# Patient Record
Sex: Male | Born: 1955 | Race: White | Hispanic: No | Marital: Single | Smoking: Never smoker
Health system: Southern US, Community
[De-identification: ages and names within clinical notes are randomized; demographics above are authoritative.]

## PROBLEM LIST (undated history)

## (undated) DIAGNOSIS — F32A Depression, unspecified: Secondary | ICD-10-CM

## (undated) DIAGNOSIS — S069X9A Unspecified intracranial injury with loss of consciousness of unspecified duration, initial encounter: Secondary | ICD-10-CM

## (undated) DIAGNOSIS — F329 Major depressive disorder, single episode, unspecified: Secondary | ICD-10-CM

## (undated) DIAGNOSIS — R569 Unspecified convulsions: Secondary | ICD-10-CM

## (undated) DIAGNOSIS — S069XAA Unspecified intracranial injury with loss of consciousness status unknown, initial encounter: Secondary | ICD-10-CM

## (undated) DIAGNOSIS — R269 Unspecified abnormalities of gait and mobility: Secondary | ICD-10-CM

## (undated) DIAGNOSIS — G40319 Generalized idiopathic epilepsy and epileptic syndromes, intractable, without status epilepticus: Secondary | ICD-10-CM

## (undated) HISTORY — DX: Unspecified intracranial injury with loss of consciousness status unknown, initial encounter: S06.9XAA

## (undated) HISTORY — DX: Unspecified abnormalities of gait and mobility: R26.9

## (undated) HISTORY — DX: Unspecified intracranial injury with loss of consciousness of unspecified duration, initial encounter: S06.9X9A

## (undated) HISTORY — PX: OTHER SURGICAL HISTORY: SHX169

## (undated) HISTORY — DX: Generalized idiopathic epilepsy and epileptic syndromes, intractable, without status epilepticus: G40.319

---

## 1997-09-08 ENCOUNTER — Emergency Department (HOSPITAL_COMMUNITY): Admission: EM | Admit: 1997-09-08 | Discharge: 1997-09-08 | Payer: Self-pay

## 1998-02-23 ENCOUNTER — Emergency Department (HOSPITAL_COMMUNITY): Admission: EM | Admit: 1998-02-23 | Discharge: 1998-02-23 | Payer: Self-pay | Admitting: Emergency Medicine

## 1998-03-21 ENCOUNTER — Emergency Department (HOSPITAL_COMMUNITY): Admission: EM | Admit: 1998-03-21 | Discharge: 1998-03-21 | Payer: Self-pay | Admitting: Emergency Medicine

## 1998-06-14 ENCOUNTER — Emergency Department (HOSPITAL_COMMUNITY): Admission: EM | Admit: 1998-06-14 | Discharge: 1998-06-14 | Payer: Self-pay | Admitting: Emergency Medicine

## 1999-03-24 ENCOUNTER — Inpatient Hospital Stay (HOSPITAL_COMMUNITY): Admission: EM | Admit: 1999-03-24 | Discharge: 1999-03-24 | Payer: Self-pay | Admitting: Emergency Medicine

## 1999-04-03 ENCOUNTER — Emergency Department (HOSPITAL_COMMUNITY): Admission: EM | Admit: 1999-04-03 | Discharge: 1999-04-03 | Payer: Self-pay | Admitting: Internal Medicine

## 1999-07-10 ENCOUNTER — Emergency Department (HOSPITAL_COMMUNITY): Admission: EM | Admit: 1999-07-10 | Discharge: 1999-07-10 | Payer: Self-pay | Admitting: Emergency Medicine

## 1999-07-29 ENCOUNTER — Encounter: Payer: Self-pay | Admitting: Emergency Medicine

## 1999-07-29 ENCOUNTER — Inpatient Hospital Stay (HOSPITAL_COMMUNITY): Admission: EM | Admit: 1999-07-29 | Discharge: 1999-07-30 | Payer: Self-pay | Admitting: Emergency Medicine

## 1999-08-16 ENCOUNTER — Inpatient Hospital Stay (HOSPITAL_COMMUNITY): Admission: EM | Admit: 1999-08-16 | Discharge: 1999-08-18 | Payer: Self-pay | Admitting: Emergency Medicine

## 1999-08-16 ENCOUNTER — Encounter: Payer: Self-pay | Admitting: Neurology

## 1999-09-26 ENCOUNTER — Emergency Department (HOSPITAL_COMMUNITY): Admission: EM | Admit: 1999-09-26 | Discharge: 1999-09-26 | Payer: Self-pay | Admitting: Emergency Medicine

## 2000-11-27 ENCOUNTER — Encounter: Payer: Self-pay | Admitting: Emergency Medicine

## 2000-11-27 ENCOUNTER — Emergency Department (HOSPITAL_COMMUNITY): Admission: EM | Admit: 2000-11-27 | Discharge: 2000-11-27 | Payer: Self-pay | Admitting: Emergency Medicine

## 2001-01-11 ENCOUNTER — Emergency Department (HOSPITAL_COMMUNITY): Admission: EM | Admit: 2001-01-11 | Discharge: 2001-01-11 | Payer: Self-pay

## 2001-01-31 ENCOUNTER — Encounter: Payer: Self-pay | Admitting: Emergency Medicine

## 2001-01-31 ENCOUNTER — Emergency Department (HOSPITAL_COMMUNITY): Admission: EM | Admit: 2001-01-31 | Discharge: 2001-01-31 | Payer: Self-pay | Admitting: Emergency Medicine

## 2001-04-05 ENCOUNTER — Emergency Department (HOSPITAL_COMMUNITY): Admission: EM | Admit: 2001-04-05 | Discharge: 2001-04-05 | Payer: Self-pay | Admitting: Emergency Medicine

## 2001-05-20 ENCOUNTER — Emergency Department (HOSPITAL_COMMUNITY): Admission: EM | Admit: 2001-05-20 | Discharge: 2001-05-20 | Payer: Self-pay | Admitting: Emergency Medicine

## 2001-06-15 ENCOUNTER — Emergency Department (HOSPITAL_COMMUNITY): Admission: EM | Admit: 2001-06-15 | Discharge: 2001-06-15 | Payer: Self-pay | Admitting: Emergency Medicine

## 2001-10-08 ENCOUNTER — Encounter: Payer: Self-pay | Admitting: Emergency Medicine

## 2001-10-08 ENCOUNTER — Emergency Department (HOSPITAL_COMMUNITY): Admission: EM | Admit: 2001-10-08 | Discharge: 2001-10-08 | Payer: Self-pay | Admitting: Emergency Medicine

## 2001-11-18 ENCOUNTER — Emergency Department (HOSPITAL_COMMUNITY): Admission: EM | Admit: 2001-11-18 | Discharge: 2001-11-18 | Payer: Self-pay | Admitting: Emergency Medicine

## 2002-02-17 ENCOUNTER — Encounter: Payer: Self-pay | Admitting: Emergency Medicine

## 2002-02-17 ENCOUNTER — Emergency Department (HOSPITAL_COMMUNITY): Admission: EM | Admit: 2002-02-17 | Discharge: 2002-02-17 | Payer: Self-pay | Admitting: Emergency Medicine

## 2002-05-27 ENCOUNTER — Observation Stay (HOSPITAL_COMMUNITY): Admission: EM | Admit: 2002-05-27 | Discharge: 2002-05-28 | Payer: Self-pay | Admitting: Emergency Medicine

## 2002-07-22 ENCOUNTER — Emergency Department (HOSPITAL_COMMUNITY): Admission: EM | Admit: 2002-07-22 | Discharge: 2002-07-22 | Payer: Self-pay | Admitting: Emergency Medicine

## 2002-08-12 ENCOUNTER — Emergency Department (HOSPITAL_COMMUNITY): Admission: EM | Admit: 2002-08-12 | Discharge: 2002-08-12 | Payer: Self-pay | Admitting: Emergency Medicine

## 2002-10-26 ENCOUNTER — Encounter: Payer: Self-pay | Admitting: Emergency Medicine

## 2002-10-26 ENCOUNTER — Emergency Department (HOSPITAL_COMMUNITY): Admission: EM | Admit: 2002-10-26 | Discharge: 2002-10-27 | Payer: Self-pay | Admitting: Emergency Medicine

## 2002-10-30 ENCOUNTER — Emergency Department (HOSPITAL_COMMUNITY): Admission: EM | Admit: 2002-10-30 | Discharge: 2002-10-30 | Payer: Self-pay | Admitting: Emergency Medicine

## 2003-05-26 ENCOUNTER — Emergency Department (HOSPITAL_COMMUNITY): Admission: EM | Admit: 2003-05-26 | Discharge: 2003-05-27 | Payer: Self-pay

## 2003-06-30 ENCOUNTER — Emergency Department (HOSPITAL_COMMUNITY): Admission: EM | Admit: 2003-06-30 | Discharge: 2003-06-30 | Payer: Self-pay | Admitting: Emergency Medicine

## 2003-07-01 ENCOUNTER — Emergency Department (HOSPITAL_COMMUNITY): Admission: EM | Admit: 2003-07-01 | Discharge: 2003-07-01 | Payer: Self-pay | Admitting: Emergency Medicine

## 2003-08-25 ENCOUNTER — Emergency Department (HOSPITAL_COMMUNITY): Admission: EM | Admit: 2003-08-25 | Discharge: 2003-08-26 | Payer: Self-pay | Admitting: Emergency Medicine

## 2003-10-18 ENCOUNTER — Emergency Department (HOSPITAL_COMMUNITY): Admission: EM | Admit: 2003-10-18 | Discharge: 2003-10-19 | Payer: Self-pay | Admitting: Emergency Medicine

## 2004-02-09 ENCOUNTER — Emergency Department (HOSPITAL_COMMUNITY): Admission: EM | Admit: 2004-02-09 | Discharge: 2004-02-09 | Payer: Self-pay | Admitting: Emergency Medicine

## 2004-02-23 ENCOUNTER — Emergency Department (HOSPITAL_COMMUNITY): Admission: EM | Admit: 2004-02-23 | Discharge: 2004-02-23 | Payer: Self-pay | Admitting: Emergency Medicine

## 2004-02-23 ENCOUNTER — Inpatient Hospital Stay (HOSPITAL_COMMUNITY): Admission: EM | Admit: 2004-02-23 | Discharge: 2004-02-25 | Payer: Self-pay | Admitting: Emergency Medicine

## 2004-02-26 ENCOUNTER — Emergency Department (HOSPITAL_COMMUNITY): Admission: EM | Admit: 2004-02-26 | Discharge: 2004-02-27 | Payer: Self-pay | Admitting: Emergency Medicine

## 2004-04-21 ENCOUNTER — Observation Stay (HOSPITAL_COMMUNITY): Admission: EM | Admit: 2004-04-21 | Discharge: 2004-04-22 | Payer: Self-pay | Admitting: Emergency Medicine

## 2004-05-30 ENCOUNTER — Ambulatory Visit: Payer: Self-pay | Admitting: Critical Care Medicine

## 2004-05-30 ENCOUNTER — Inpatient Hospital Stay (HOSPITAL_COMMUNITY): Admission: EM | Admit: 2004-05-30 | Discharge: 2004-06-02 | Payer: Self-pay | Admitting: Emergency Medicine

## 2005-06-25 ENCOUNTER — Observation Stay (HOSPITAL_COMMUNITY): Admission: EM | Admit: 2005-06-25 | Discharge: 2005-06-26 | Payer: Self-pay | Admitting: Emergency Medicine

## 2006-03-21 IMAGING — CR DG CHEST 2V
2 series · 2 of 2 positions shown · non-contrast
Comparison: Report of chest x-ray 11/27/00 reviewed.

CLINICAL DATA: Seizure.  
 PA AND LATERAL CHEST:

[view not recorded (1 of 2)]
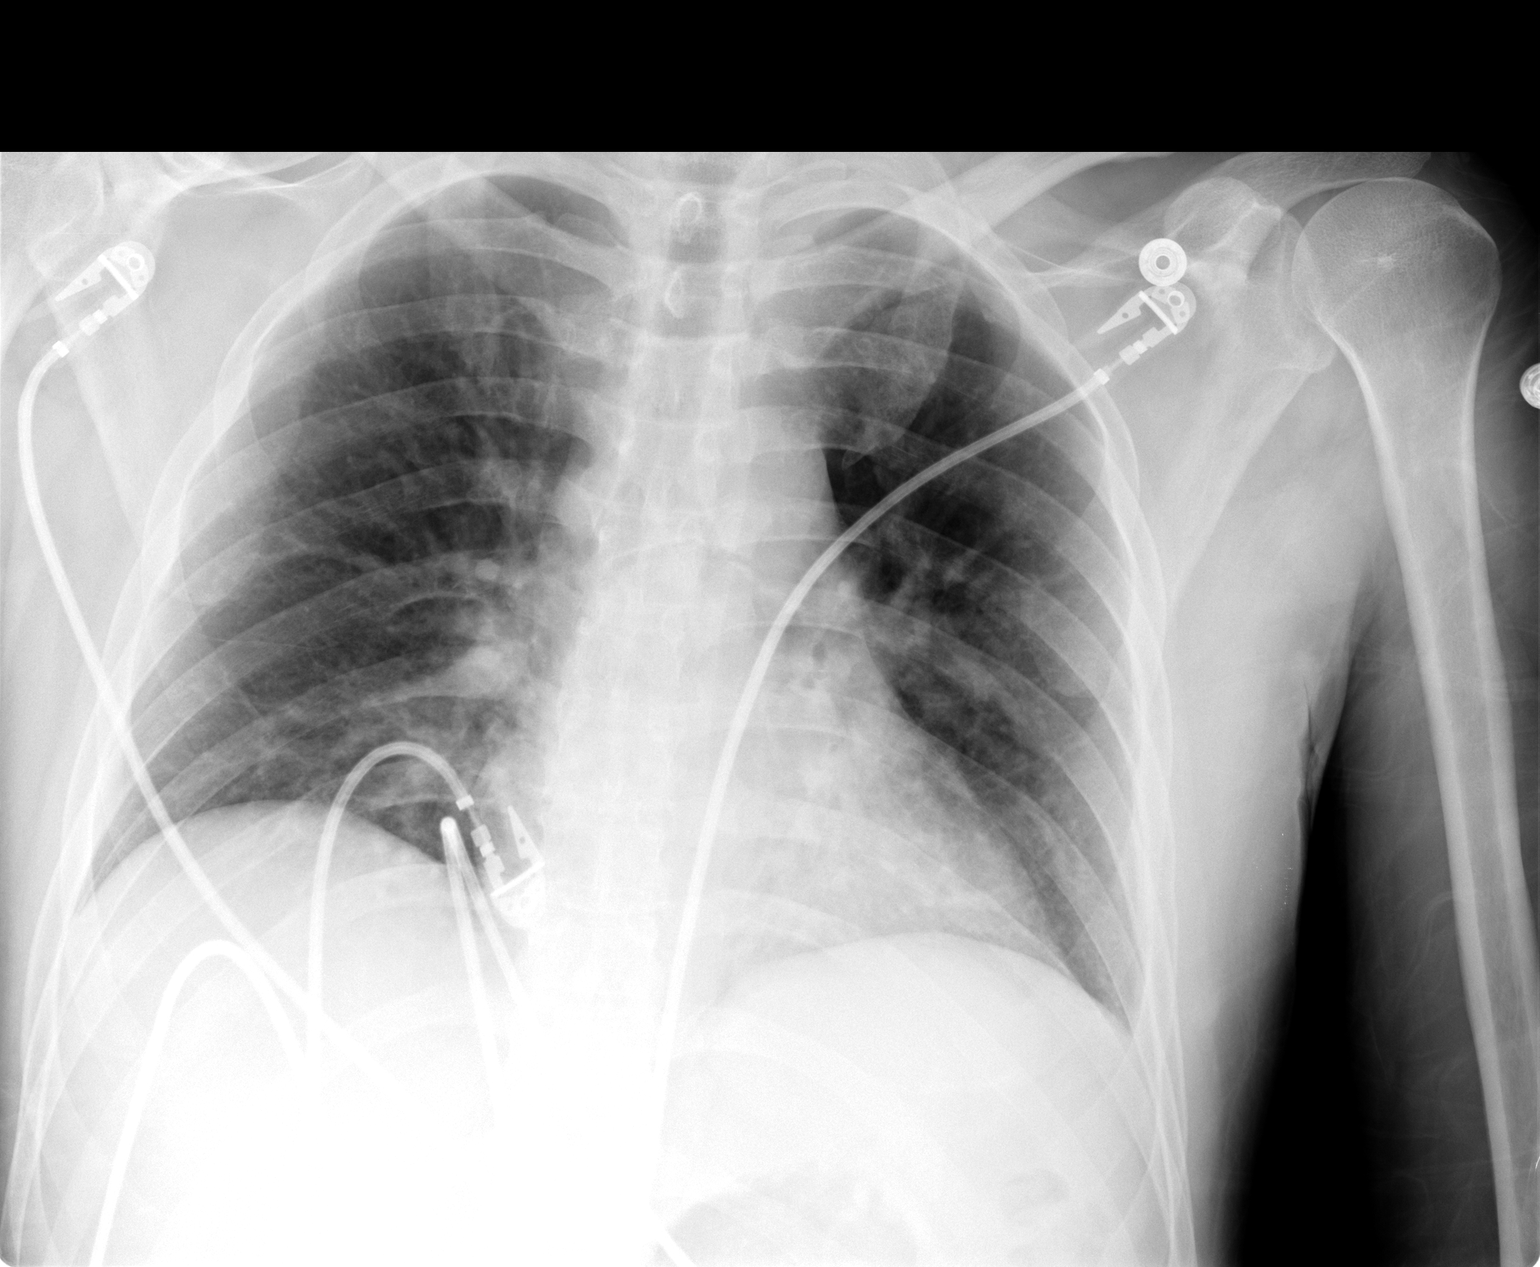

[view not recorded (2 of 2)]
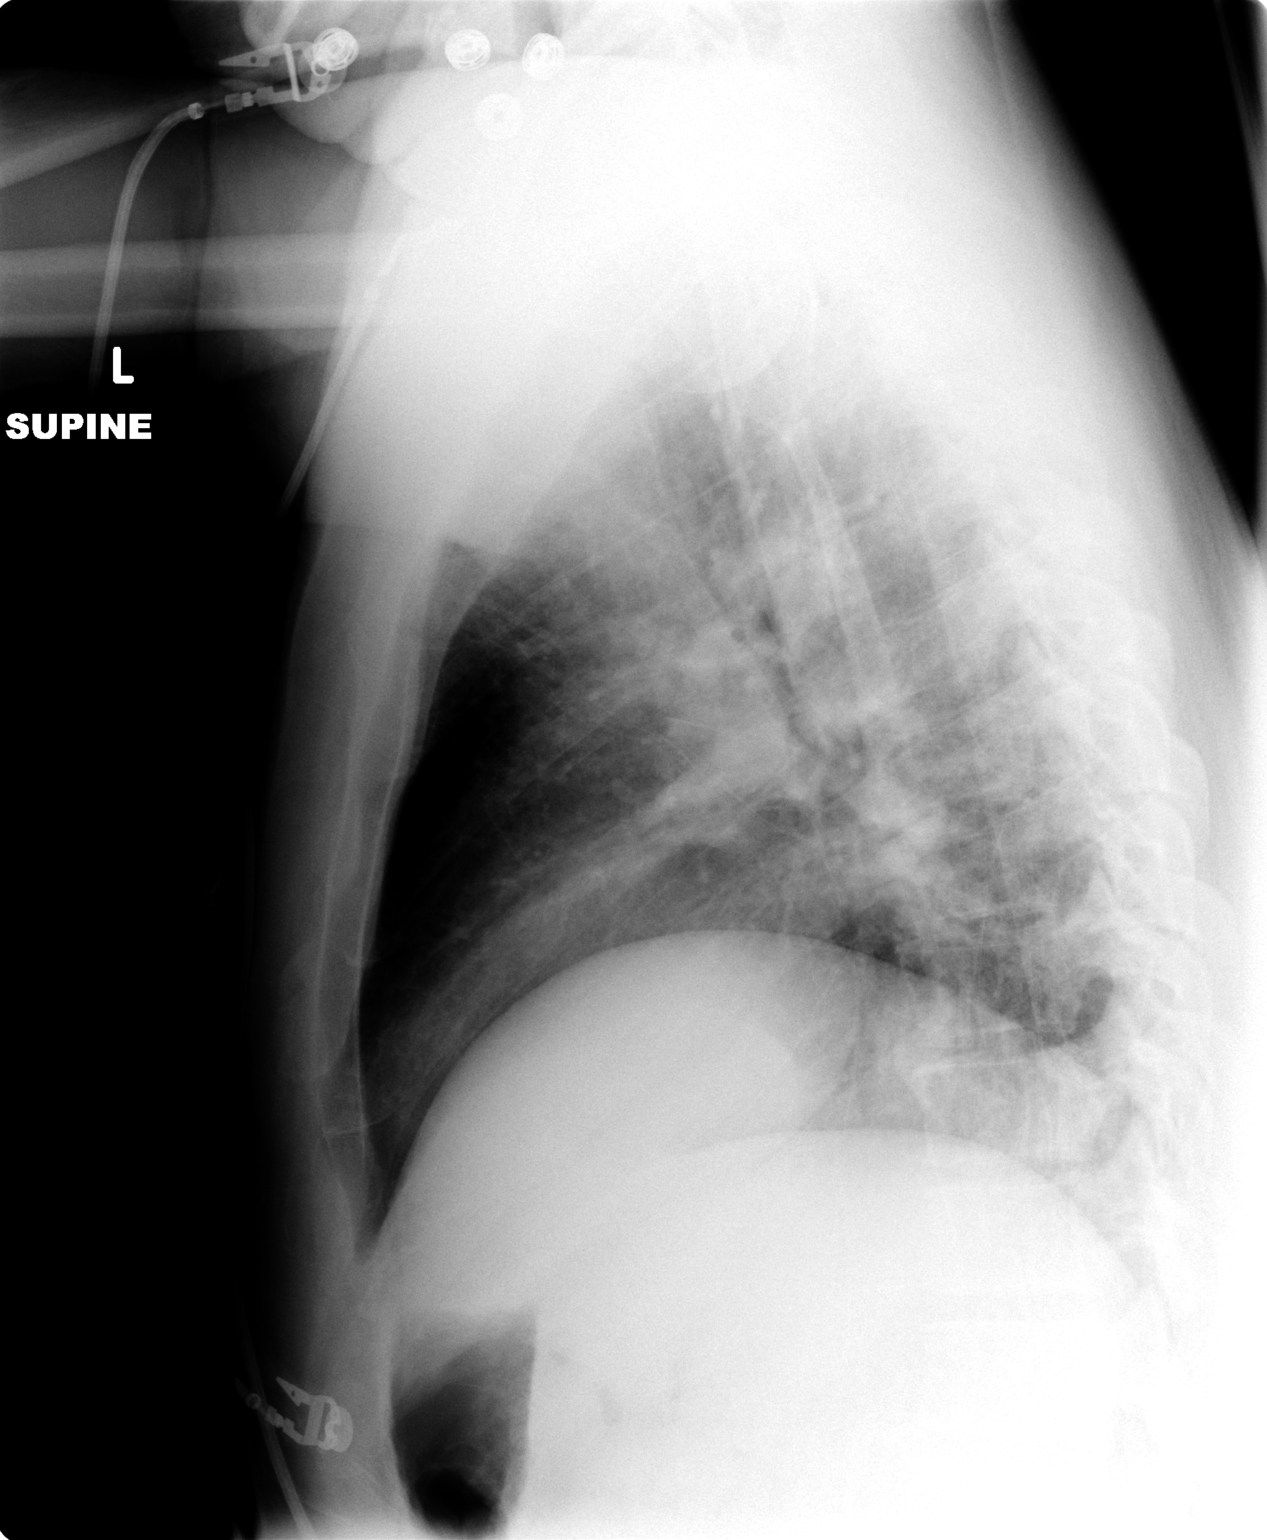

[2 of 2 positions shown; findings below may reference images not displayed]

FINDINGS: There is some airspace disease identified in the lower lung zones bilaterally with some left mid lung zone airspace disease also identified.  No pleural effusion.  Heart size normal.  No focal bony abnormality.
IMPRESSION: Bibasilar airspace disease.  This could represent aspiration or infection.

## 2006-03-21 IMAGING — CT CT HEAD W/O CM
1 series · 16 of 30 positions shown, 20 images · non-contrast
Comparison: 02/09/04.

CLINICAL DATA: Seizures and headaches.  
 HEAD CT WITHOUT CONTRAST 02/23/04:
TECHNIQUE: Contiguous axial CT images were taken from the skull base to the vertex without contrast administration.

[Series 2: brain · axial · 0.49mm/px · z∈[+146,+290]mm · 16 of 32 slices shown, 20 images]
[im 2/32  brain]
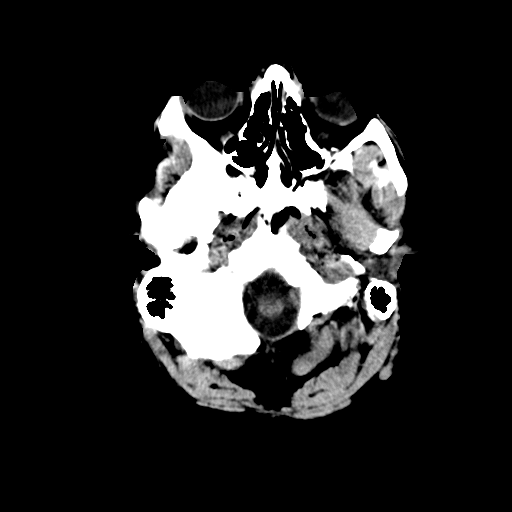
[im 2/32  bone]
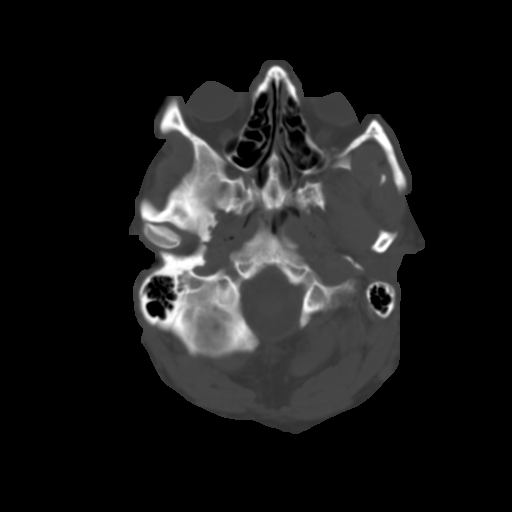
[im 4/32  brain]
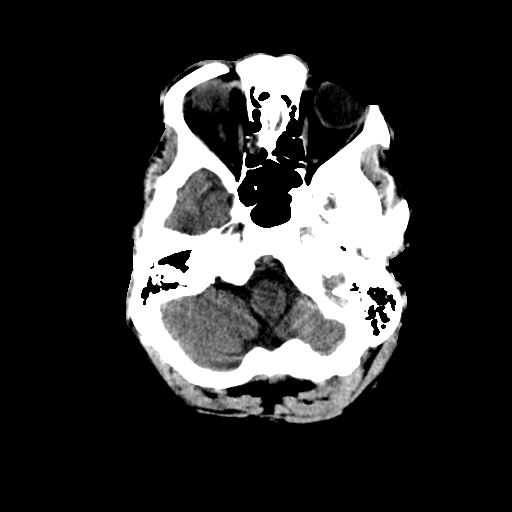
[im 6/32  brain]
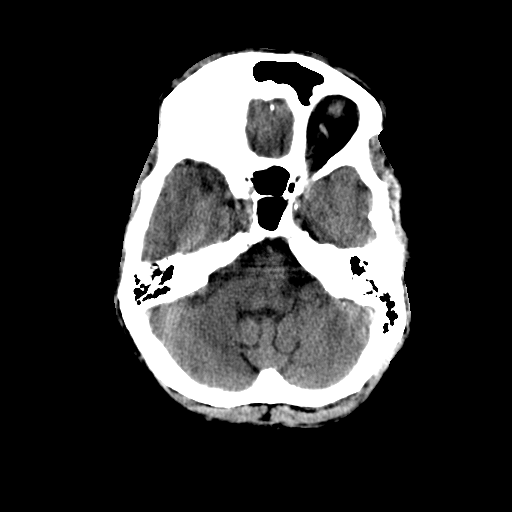
[im 8/32  brain]
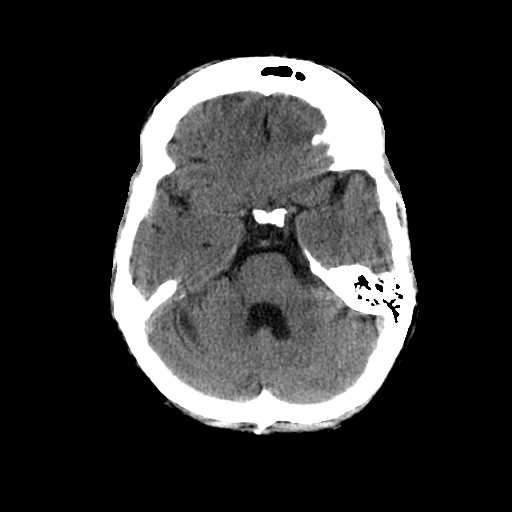
[im 9/32  brain]
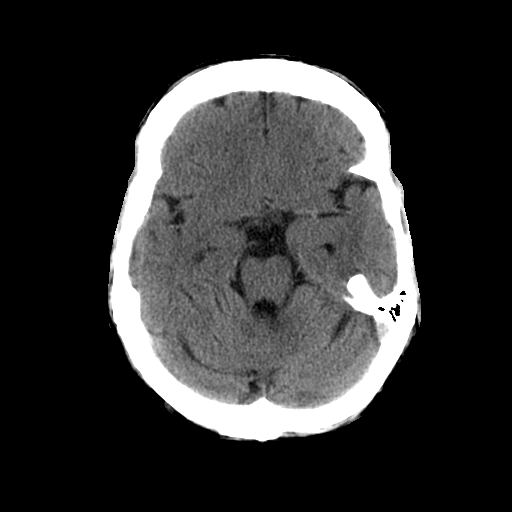
[im 9/32  bone]
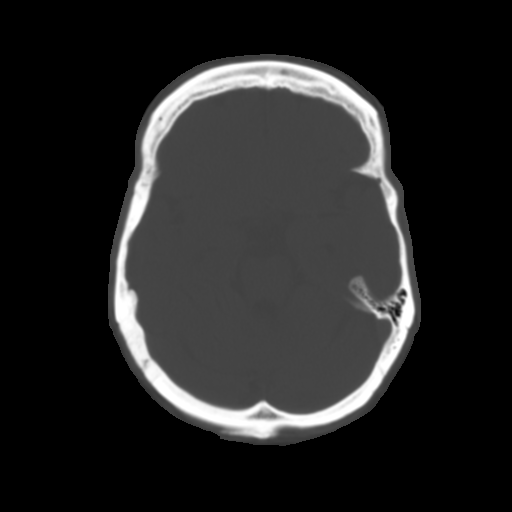
[im 11/32  brain]
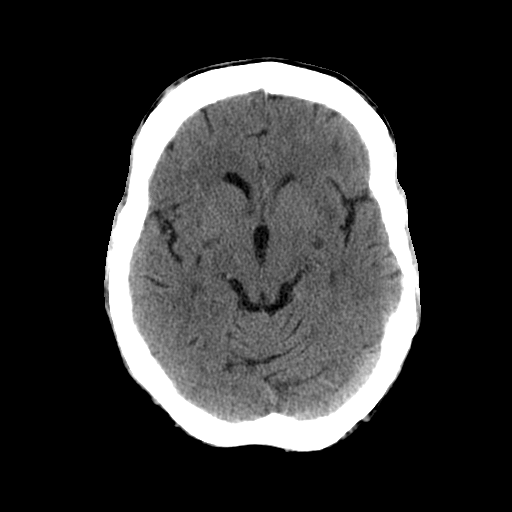
[im 13/32  brain]
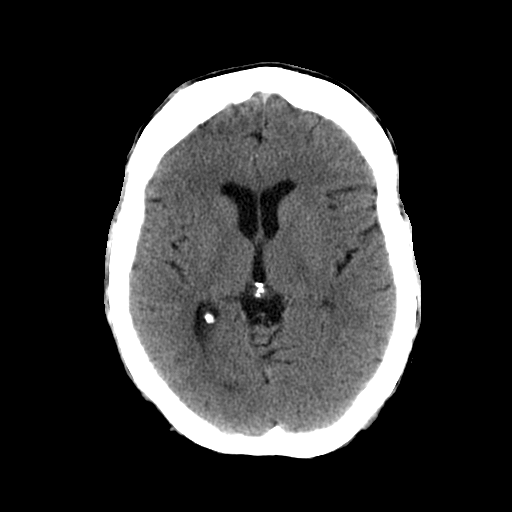
[im 15/32  brain]
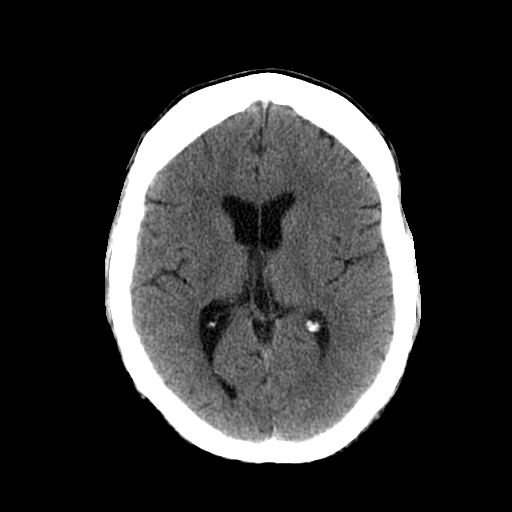
[im 17/32  brain]
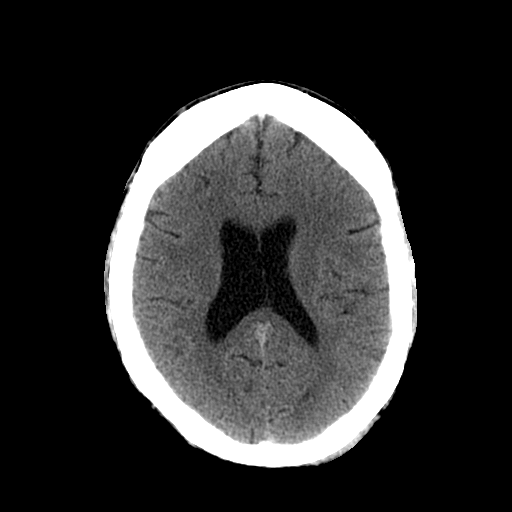
[im 17/32  bone]
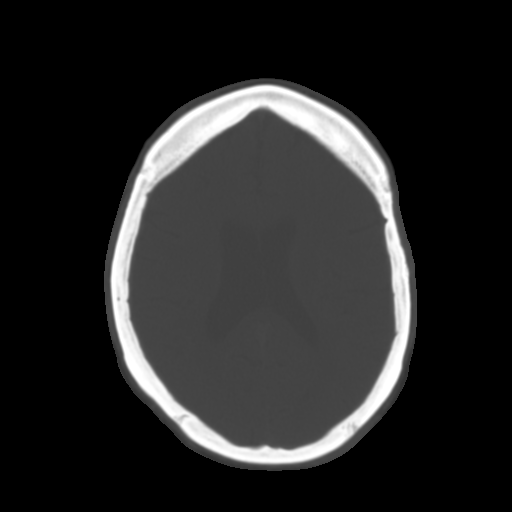
[im 19/32  brain]
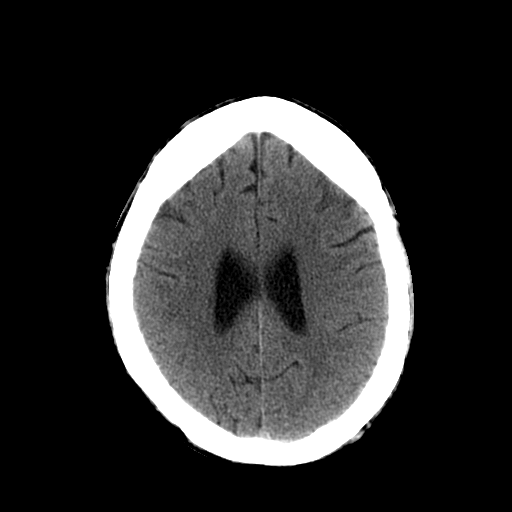
[im 21/32  brain]
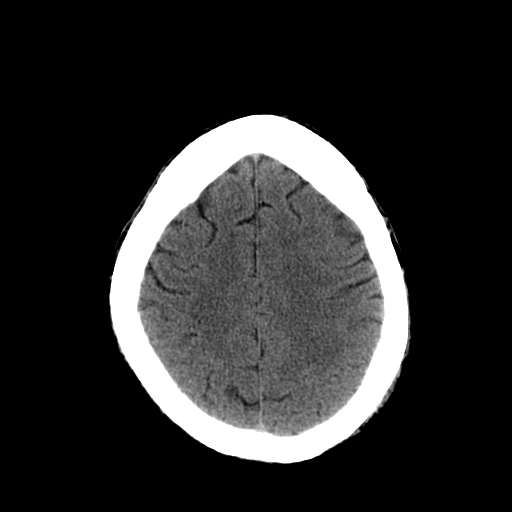
[im 23/32  brain]
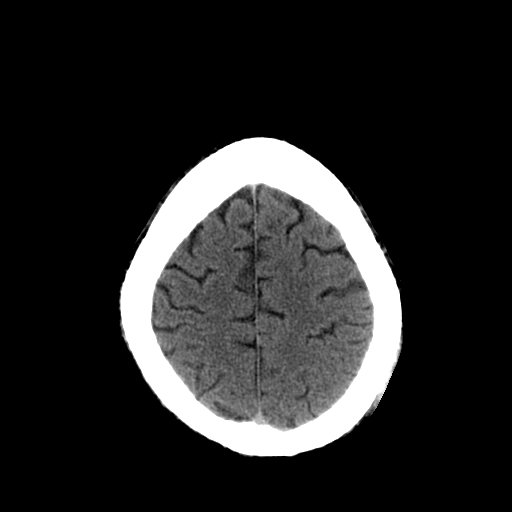
[im 24/32  brain]
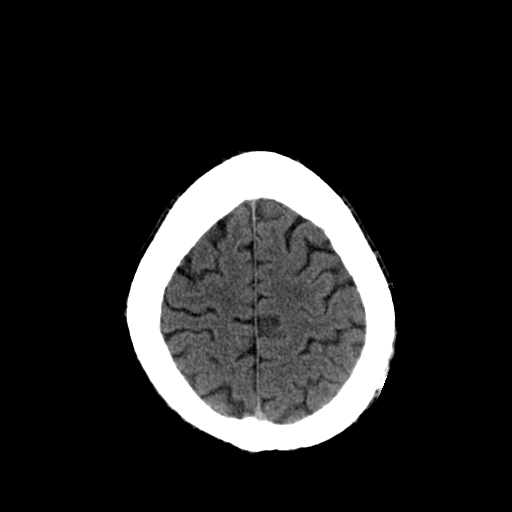
[im 24/32  bone]
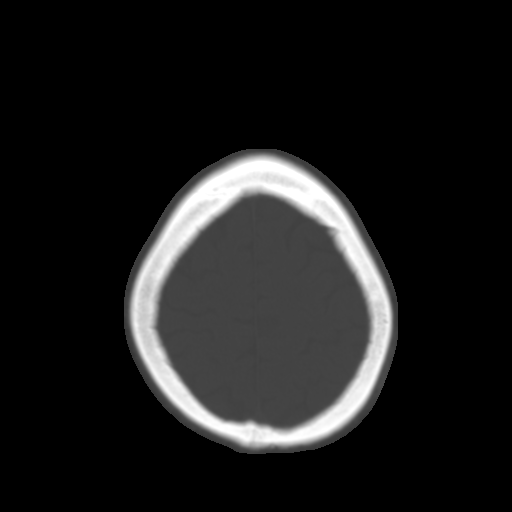
[im 26/32  brain]
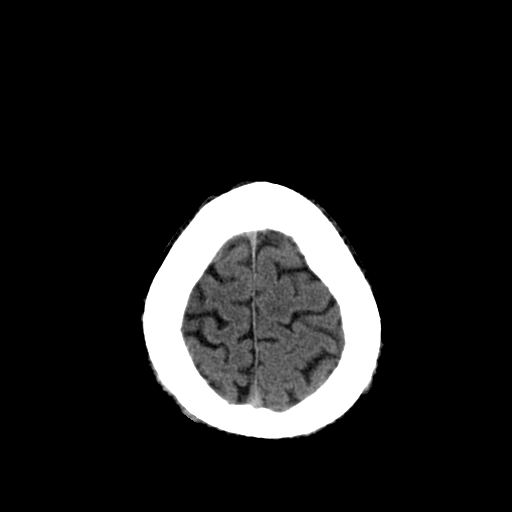
[im 28/32  brain]
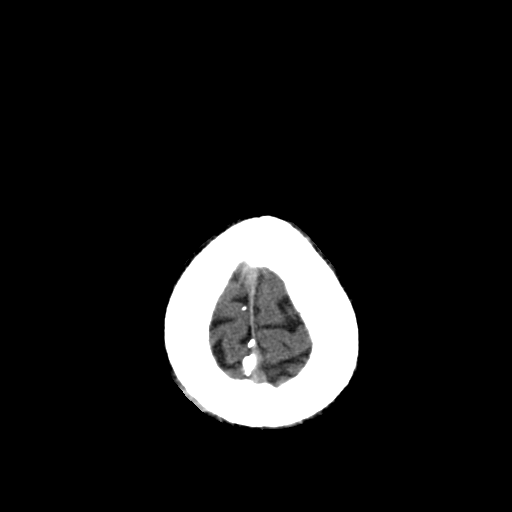
[im 30/32  brain]
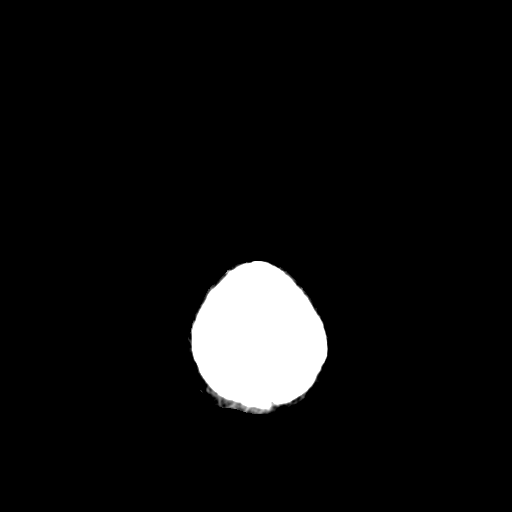

[16 of 30 positions shown; findings below may reference images not displayed]

FINDINGS: No evidence of acute intracranial abnormality including hemorrhage, infarct, mass, mass effect, midline or abnormal extraaxial fluid collections identified.  Mild atrophy is unchanged.  Minimal ethmoid sinus opacification is again noted.
IMPRESSION: No acute abnormality.

## 2012-07-25 ENCOUNTER — Other Ambulatory Visit: Payer: Self-pay | Admitting: Neurology

## 2012-07-26 ENCOUNTER — Other Ambulatory Visit: Payer: Self-pay | Admitting: Neurology

## 2012-07-26 ENCOUNTER — Other Ambulatory Visit: Payer: Self-pay

## 2012-07-26 MED ORDER — LAMICTAL 200 MG PO TABS: ORAL_TABLET | ORAL | Status: DC

## 2012-07-26 MED ORDER — PHENOBARBITAL 97.2 MG PO TABS: 97.2000 mg | ORAL_TABLET | Freq: Every evening | ORAL | Status: DC

## 2012-07-26 MED ORDER — LEVETIRACETAM 750 MG PO TABS: 750.0000 mg | ORAL_TABLET | Freq: Two times a day (BID) | ORAL | Status: DC

## 2012-08-13 ENCOUNTER — Encounter (HOSPITAL_COMMUNITY): Payer: Self-pay | Admitting: Emergency Medicine

## 2012-08-13 ENCOUNTER — Emergency Department (HOSPITAL_COMMUNITY)
Admission: EM | Admit: 2012-08-13 | Discharge: 2012-08-13 | Disposition: A | Discharge status: Home / Self Care | Payer: Medicare Other | Attending: Emergency Medicine | Admitting: Emergency Medicine

## 2012-08-13 DIAGNOSIS — Z760 Encounter for issue of repeat prescription: Secondary | ICD-10-CM | POA: Insufficient documentation

## 2012-08-13 DIAGNOSIS — Z79899 Other long term (current) drug therapy: Secondary | ICD-10-CM

## 2012-08-13 HISTORY — DX: Unspecified convulsions: R56.9

## 2012-08-13 HISTORY — DX: Depression, unspecified: F32.A

## 2012-08-13 HISTORY — DX: Major depressive disorder, single episode, unspecified: F32.9

## 2012-08-13 MED ORDER — LEVETIRACETAM 750 MG PO TABS
750.0000 mg | ORAL_TABLET | Freq: Two times a day (BID) | ORAL | Status: DC
  Administered 2012-08-13: 750 mg via ORAL
  Filled 2012-08-13: qty 1

## 2012-08-13 MED ORDER — LAMOTRIGINE 200 MG PO TABS
200.0000 mg | ORAL_TABLET | Freq: Once | ORAL | Status: AC
  Administered 2012-08-13: 200 mg via ORAL
  Filled 2012-08-13: qty 1

## 2012-08-13 NOTE — ED Provider Notes (Signed)
   History    CSN: [id number] Arrival date & time 08/13/12  0105  First MD Initiated Contact with Patient 08/13/12 0122     Chief Complaint  Patient presents with  . Medication Refill   (Consider location/radiation/quality/duration/timing/severity/associated sxs/prior Treatment) The history is provided by the patient. No language interpreter was used.  Patient states he needs to walk home as he missed the last bus but hasn't had his evening medications and is here asking for not refills but doses of his meds so he can walk home.  No complaints at this time.  He is awake alert pleasant and cooperative.   No past medical history on file. No past surgical history on file. No family history on file. History  Substance Use Topics  . Smoking status: Not on file  . Smokeless tobacco: Not on file  . Alcohol Use: Not on file    Review of Systems  Constitutional: Negative for fever.  Respiratory: Negative for shortness of breath.   All other systems reviewed and are negative.    Allergies  Review of patient's allergies indicates not on file.  Home Medications   Current Outpatient Rx  Name  Route  Sig  Dispense  Refill  . LAMICTAL 200 MG tablet      One tablet in the morning and one and one half tablet in the evening Brand Medically Necessary   75 tablet   6     Dispense as written.   . levETIRAcetam (KEPPRA) 750 MG tablet   Oral   Take 1 tablet (750 mg total) by mouth 2 (two) times daily. Brand Medically Necessary   60 tablet   6     Dispense as written.   Marland Kitchen PHENobarbital (LUMINAL) 97.2 MG tablet   Oral   Take 1 tablet (97.2 mg total) by mouth every evening.   30 tablet   5     Pharmacy Fax [phone]    There were no vitals taken for this visit. Physical Exam  Constitutional: He is oriented to person, place, and time. He appears well-developed and well-nourished. No distress.  HENT:  Head: Normocephalic and atraumatic.  Mouth/Throat: Oropharynx is clear and  moist.  Eyes: Conjunctivae are normal. Pupils are equal, round, and reactive to light.  Neck: Normal range of motion. Neck supple.  Cardiovascular: Normal rate and regular rhythm.   Pulmonary/Chest: Effort normal and breath sounds normal. He has no wheezes.  Abdominal: Soft. Bowel sounds are normal. There is no tenderness. There is no rebound and no guarding.  Musculoskeletal: Normal range of motion.  Neurological: He is alert and oriented to person, place, and time.  Skin: Skin is warm and dry.  Psychiatric: He has a normal mood and affect.    ED Course  Procedures (including critical care time) Labs Reviewed - No data to display No results found. No diagnosis found.  MDM  Have offered patient soda and crackers to coat his stomach prior to evening meds which were ordered he is happy with this plan.  Will d/c to home post medication  Julina Altmann K Zeph Riebel-Rasch, MD 08/13/12 1478

## 2012-08-13 NOTE — ED Notes (Signed)
[place]. He does not want to pay for a cab and needs a dose of his medications. Pt takes Kepra, Lamictal and Phenobarbital.

## 2012-09-02 ENCOUNTER — Telehealth: Payer: Self-pay | Admitting: Neurology

## 2012-09-02 NOTE — Telephone Encounter (Signed)
The patient recently sent a letter indicating that he is having difficulty getting an appointment. We will try to get him seen through a NP. Otherwise, he is doing well with his seizures, he just needs followup and blood work.

## 2013-02-15 ENCOUNTER — Telehealth: Payer: Self-pay | Admitting: *Deleted

## 2013-02-15 ENCOUNTER — Other Ambulatory Visit: Payer: Self-pay | Admitting: Neurology

## 2013-02-15 MED ORDER — LEVETIRACETAM 750 MG PO TABS: 750.0000 mg | ORAL_TABLET | Freq: Two times a day (BID) | ORAL | Status: DC

## 2013-02-15 MED ORDER — LAMICTAL 200 MG PO TABS: ORAL_TABLET | ORAL | Status: DC

## 2013-02-15 NOTE — Telephone Encounter (Signed)
Rx's have been sent. 

## 2013-02-16 ENCOUNTER — Ambulatory Visit: Payer: Self-pay | Admitting: Neurology

## 2013-02-16 ENCOUNTER — Telehealth: Payer: Self-pay | Admitting: Neurology

## 2013-02-16 NOTE — Telephone Encounter (Signed)
This patient did not show up for a revisit appointment today. 

## 2013-05-08 ENCOUNTER — Emergency Department (HOSPITAL_COMMUNITY): Payer: Medicare Other

## 2013-05-08 ENCOUNTER — Emergency Department (HOSPITAL_COMMUNITY)
Admission: EM | Admit: 2013-05-08 | Discharge: 2013-05-08 | Discharge status: Against Medical Advice | Payer: Medicare Other | Attending: Emergency Medicine | Admitting: Emergency Medicine

## 2013-05-08 ENCOUNTER — Encounter (HOSPITAL_COMMUNITY): Payer: Self-pay | Admitting: Emergency Medicine

## 2013-05-08 DIAGNOSIS — R079 Chest pain, unspecified: Secondary | ICD-10-CM | POA: Insufficient documentation

## 2013-05-08 NOTE — ED Notes (Signed)
Pt would like to leave AMA. He states "my pain is gone so i don't need to see a doctor".
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
## 2013-08-27 ENCOUNTER — Other Ambulatory Visit: Payer: Self-pay | Admitting: Neurology

## 2013-08-27 NOTE — Telephone Encounter (Signed)
Patient has not been seen in over 1 year, no showed last appt  

## 2013-09-01 ENCOUNTER — Telehealth: Payer: Self-pay | Admitting: Neurology

## 2013-09-01 ENCOUNTER — Ambulatory Visit: Payer: Self-pay | Admitting: Neurology

## 2013-09-01 NOTE — Telephone Encounter (Signed)
This patient did not show for a revisit appointment today. 

## 2013-09-04 ENCOUNTER — Ambulatory Visit (INDEPENDENT_AMBULATORY_CARE_PROVIDER_SITE_OTHER): Payer: Medicare Other | Admitting: Neurology

## 2013-09-04 ENCOUNTER — Telehealth: Payer: Self-pay | Admitting: Neurology

## 2013-09-04 ENCOUNTER — Ambulatory Visit: Payer: Self-pay | Admitting: Neurology

## 2013-09-04 ENCOUNTER — Encounter: Payer: Self-pay | Admitting: Neurology

## 2013-09-04 VITALS — BP 127/67 | HR 66 | Wt 160.0 lb

## 2013-09-04 DIAGNOSIS — Z5181 Encounter for therapeutic drug level monitoring: Secondary | ICD-10-CM

## 2013-09-04 DIAGNOSIS — G40319 Generalized idiopathic epilepsy and epileptic syndromes, intractable, without status epilepticus: Secondary | ICD-10-CM | POA: Diagnosis not present

## 2013-09-04 HISTORY — DX: Generalized idiopathic epilepsy and epileptic syndromes, intractable, without status epilepticus: G40.319

## 2013-09-04 MED ORDER — PHENOBARBITAL 97.2 MG PO TABS: 97.2000 mg | ORAL_TABLET | Freq: Every day | ORAL | Status: DC

## 2013-09-04 MED ORDER — LEVETIRACETAM 750 MG PO TABS: 750.0000 mg | ORAL_TABLET | Freq: Two times a day (BID) | ORAL | Status: DC

## 2013-09-04 MED ORDER — LAMOTRIGINE 200 MG PO TABS: ORAL_TABLET | ORAL | Status: DC

## 2013-09-04 NOTE — Progress Notes (Signed)
    Reason for visit: Seizures  Craig PapJames E Fox is an 58 y.o. male  History of present illness:  Craig Fox is a 58 year old right-handed white male with a history of intractable seizures. He currently lives alone in an apartment, and he relies on a bus to get around. The patient indicates that during the hot months of summer, his seizures are a bit more frequent. He has had a seizure with sleep within the last several days. He indicates that his seizures usually are under good control. His does not operate a motor vehicle. The patient is on phenobarbital, Lamictal, and Keppra. He is tolerating medications fairly well. He does have a mild gait disturbance, denies any recent falls.  Past Medical History  Diagnosis Date  . Seizures   . Depression   . 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 Traumatic brain injury   . Gait disorder     Past Surgical History  Procedure Laterality Date  . Traumatic amputation finger Left     Ring finger    Family History  Problem Relation Age of Onset  . Mental illness Mother   . Heart attack Father     Social history:  reports that he has never smoked. He has never used smokeless tobacco. He reports that he does not drink alcohol or use illicit drugs.   No Known Allergies  Medications:  No current outpatient prescriptions on file prior to visit.   No current facility-administered medications on file prior to visit.    ROS:  Out of a complete 14 system review of symptoms, the patient complains only of the following symptoms, and all other reviewed systems are negative.  Appetite change Weight loss Seizures  Blood pressure 127/67, pulse 66, weight 160 lb (72.576 kg).  Physical Exam  General: The patient is alert and cooperative at the time of the examination.  Skin: No significant peripheral edema is noted.   Neurologic Exam  Mental status: The patient is oriented x 3.  Cranial nerves: Facial  symmetry is present. Speech is normal, no aphasia or dysarthria is noted. Extraocular movements are full. Visual fields are full.  Motor: The patient has good strength in all 4 extremities.  Sensory examination: Soft touch sensation is symmetric on the face, arms, and legs.  Coordination: The patient has good heel-to-shin bilaterally. Slight dysmetria seen with finger-nose-finger bilaterally.  Gait and station: The patient has a slight circumduction gait with the right leg. Tandem gait is unsteady. Romberg is negative. No drift is seen.  Reflexes: Deep tendon reflexes are symmetric.   Assessment/Plan:  1. Traumatic brain injury  2. Intractable seizures  The patient continues to have occasional seizures with his medication regimen. The patient lives alone, and he has difficulty keeping up with his appointments. He has not shown up for the last 4 revisits scheduled. The patient does not operate a motor vehicle, but he may do better if he were living in a group home situation. The patient will have blood work drawn today, he will have a revisit in 6 months. He has no primary care physician.  Marlan Palau. Keith Willis MD 09/04/2013 2:15 PM  [facility] [address] [place], [place] [id number]  Phone [phone] Fax [phone]

## 2013-09-04 NOTE — Patient Instructions (Signed)

## 2013-09-04 NOTE — Telephone Encounter (Signed)
This patient did not show for a revisit appointment today. 

## 2013-09-05 LAB — COMPREHENSIVE METABOLIC PANEL
ALBUMIN: 4.8 g/dL (ref 3.5–5.5)
ALT: 16 IU/L (ref 0–44)
AST: 23 IU/L (ref 0–40)
Albumin/Globulin Ratio: 2.2 (ref 1.1–2.5)
Alkaline Phosphatase: 142 IU/L — ABNORMAL HIGH (ref 39–117)
BILIRUBIN TOTAL: 0.4 mg/dL (ref 0.0–1.2)
BUN/Creatinine Ratio: 16 (ref 9–20)
BUN: 14 mg/dL (ref 6–24)
CALCIUM: 9.8 mg/dL (ref 8.7–10.2)
CHLORIDE: 100 mmol/L (ref 97–108)
CO2: 23 mmol/L (ref 18–29)
Creatinine, Ser: 0.9 mg/dL (ref 0.76–1.27)
GFR, EST AFRICAN AMERICAN: 109 mL/min/{1.73_m2} (ref >59)
GFR, EST NON AFRICAN AMERICAN: 94 mL/min/{1.73_m2} (ref >59)
GLUCOSE: 74 mg/dL (ref 65–99)
Globulin, Total: 2.2 g/dL (ref 1.5–4.5)
Potassium: 4.1 mmol/L (ref 3.5–5.2)
Sodium: 144 mmol/L (ref 134–144)
TOTAL PROTEIN: 7 g/dL (ref 6.0–8.5)

## 2013-09-05 LAB — CBC WITH DIFFERENTIAL
BASOS ABS: 0 10*3/uL (ref 0.0–0.2)
BASOS: 0 %
EOS: 1 %
Eosinophils Absolute: 0.1 10*3/uL (ref 0.0–0.4)
HEMATOCRIT: 41.7 % (ref 37.5–51.0)
HEMOGLOBIN: 14.2 g/dL (ref 12.6–17.7)
Immature Grans (Abs): 0 10*3/uL (ref 0.0–0.1)
Immature Granulocytes: 0 %
LYMPHS ABS: 1.2 10*3/uL (ref 0.7–3.1)
LYMPHS: 18 %
MCH: 29.6 pg (ref 26.6–33.0)
MCHC: 34.1 g/dL (ref 31.5–35.7)
MCV: 87 fL (ref 79–97)
MONOCYTES: 9 %
Monocytes Absolute: 0.6 10*3/uL (ref 0.1–0.9)
NEUTROS ABS: 4.9 10*3/uL (ref 1.4–7.0)
Neutrophils Relative %: 72 %
Platelets: 249 10*3/uL (ref 150–379)
RBC: 4.8 x10E6/uL (ref 4.14–5.80)
RDW: 13.6 % (ref 12.3–15.4)
WBC: 6.7 10*3/uL (ref 3.4–10.8)

## 2013-09-05 LAB — LAMOTRIGINE LEVEL: LAMOTRIGINE LVL: 10.2 ug/mL (ref 2.0–20.0)

## 2013-09-05 LAB — PHENOBARBITAL LEVEL: PHENOBARBITAL LVL: 25 ug/mL (ref 15–40)

## 2013-09-08 ENCOUNTER — Encounter: Payer: Self-pay | Admitting: *Deleted

## 2013-09-08 NOTE — Progress Notes (Signed)
Quick Note:  I sent letter to pt, since I have not been able to connect with by phone. ______

## 2014-01-17 ENCOUNTER — Telehealth: Payer: Self-pay | Admitting: Neurology

## 2014-01-17 NOTE — Telephone Encounter (Signed)
Pt states he got a notice from [facility] that lamoTRIgine (LAMICTAL) 200 MG tablet and levETIRAcetam (KEPPRA) 750 MG tablet will no longer be covered.  He states that he needs brand name and he needs someone to contact [facility]. You can call him back and if he does not answer you can leave him a voicemail.

## 2014-01-17 NOTE — Telephone Encounter (Signed)
I have provided all clinical info requested by ins.  The requests are currently under review.  I called the patient back.  Phone rang over 20 times with no answer.  Voicemail did not pick up.  Unable to leave message.

## 2014-01-18 NOTE — Telephone Encounter (Signed)
[employer] sent us a reply saying the patient does not have active prescription coverage with them.  I called the patient back.  He said he may have forgotten to send them a check for payment.  I asked the patient if he had his Rx card, so we could verify we have the correct info.  Patient says his Ins is with [place].  ID# [id number] BIN: [id number] Group: [id number] PCN: [id number] Phone [phone].  I have contacted this plan and provided clinical info.  They will review the request and notify the patient of outcome.  They will call us back if anything further is needed. Patient is aware.

## 2014-01-22 ENCOUNTER — Telehealth: Payer: Self-pay | Admitting: Neurology

## 2014-01-22 MED ORDER — LAMOTRIGINE 200 MG PO TABS: ORAL_TABLET | ORAL | Status: DC

## 2014-01-22 MED ORDER — LEVETIRACETAM 750 MG PO TABS: 750.0000 mg | ORAL_TABLET | Freq: Two times a day (BID) | ORAL | Status: DC

## 2014-01-22 NOTE — Telephone Encounter (Signed)
The patient sent a letter indicating that the insurance changed his Keppra and Lamictal to generic. I will rewrite prescriptions indicating that they are to be brand-name only.

## 2014-03-13 ENCOUNTER — Ambulatory Visit: Payer: Medicare Other | Admitting: Adult Health

## 2014-03-20 ENCOUNTER — Telehealth: Payer: Self-pay | Admitting: Neurology

## 2014-03-20 ENCOUNTER — Other Ambulatory Visit: Payer: Self-pay | Admitting: Neurology

## 2014-03-20 MED ORDER — LAMOTRIGINE 200 MG PO TABS: ORAL_TABLET | ORAL | Status: DC

## 2014-03-20 MED ORDER — LEVETIRACETAM 750 MG PO TABS: 750.0000 mg | ORAL_TABLET | Freq: Two times a day (BID) | ORAL | Status: DC

## 2014-03-20 NOTE — Telephone Encounter (Signed)
Request for BMN Rx's sent to the pharmacy.  I called the pharmacy and spoke with Clydie BraunKaren.  She said they have placed the refills on hold because the patient just got these filled the end of Jan, so it is too soon.  She said they did go through ins, and no prior auth was required.  I called the patient back.  He verbalized understanding.

## 2014-03-20 NOTE — Telephone Encounter (Signed)
Pt is calling needing a refill on PHENobarbital (LUMINAL) 97.2 MG tablet. [facility] [address]  He needs this as soon as possible. Pt also wants to know where he can get his Rx's cheaper. Please leave a message if no answer his phone will just beep there is no audio voice. He states that he has a refill, but the pharmacy will not give it to him.  He seems to be confused. Please advise.

## 2014-03-20 NOTE — Telephone Encounter (Signed)
Rx signed and faxed.

## 2014-03-20 NOTE — Telephone Encounter (Signed)
Patient is calling as he needs authorization for Rx Keppra 750 mg and Lamictal 200 mg.  Please see note from Dr Anne HahnWillis on 01/22/14 approving these brand names.Please call patient.  Thanks!

## 2014-04-02 ENCOUNTER — Ambulatory Visit: Payer: Self-pay | Admitting: Adult Health

## 2014-04-03 ENCOUNTER — Encounter: Payer: Self-pay | Admitting: Adult Health

## 2014-05-07 ENCOUNTER — Ambulatory Visit (INDEPENDENT_AMBULATORY_CARE_PROVIDER_SITE_OTHER): Payer: Self-pay | Admitting: Adult Health

## 2014-05-07 ENCOUNTER — Encounter: Payer: Self-pay | Admitting: Adult Health

## 2014-05-07 VITALS — BP 122/62 | HR 79 | Ht 72.0 in | Wt 162.0 lb

## 2014-05-07 DIAGNOSIS — Z5181 Encounter for therapeutic drug level monitoring: Secondary | ICD-10-CM | POA: Diagnosis not present

## 2014-05-07 DIAGNOSIS — G40319 Generalized idiopathic epilepsy and epileptic syndromes, intractable, without status epilepticus: Secondary | ICD-10-CM

## 2014-05-07 DIAGNOSIS — G40311 Generalized idiopathic epilepsy and epileptic syndromes, intractable, with status epilepticus: Secondary | ICD-10-CM

## 2014-05-07 MED ORDER — PHENOBARBITAL 97.2 MG PO TABS: 97.2000 mg | ORAL_TABLET | Freq: Every day | ORAL | Status: DC

## 2014-05-07 MED ORDER — LEVETIRACETAM 750 MG PO TABS: 750.0000 mg | ORAL_TABLET | Freq: Two times a day (BID) | ORAL | Status: DC

## 2014-05-07 MED ORDER — LAMOTRIGINE 200 MG PO TABS: ORAL_TABLET | ORAL | Status: DC

## 2014-05-07 NOTE — Progress Notes (Signed)
I have read the note, and I agree with the clinical assessment and plan.  Lemont Sitzmann KEITH   
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
## 2014-05-07 NOTE — Patient Instructions (Signed)
Continue Lamictal, Phenobarbital and Keppra I will check blood work today. If you have any additional seizures please let us know.

## 2014-05-08 LAB — COMPREHENSIVE METABOLIC PANEL
A/G RATIO: 2.5 (ref 1.1–2.5)
ALBUMIN: 4.7 g/dL (ref 3.5–5.5)
ALK PHOS: 148 IU/L — AB (ref 39–117)
ALT: 19 IU/L (ref 0–44)
AST: 24 IU/L (ref 0–40)
BUN / CREAT RATIO: 10 (ref 9–20)
BUN: 9 mg/dL (ref 6–24)
Bilirubin Total: <0.2 mg/dL (ref 0.0–1.2)
CO2: 26 mmol/L (ref 18–29)
Calcium: 10 mg/dL (ref 8.7–10.2)
Chloride: 98 mmol/L (ref 97–108)
Creatinine, Ser: 0.86 mg/dL (ref 0.76–1.27)
GFR calc non Af Amer: 96 mL/min/{1.73_m2} (ref >59)
GFR, EST AFRICAN AMERICAN: 110 mL/min/{1.73_m2} (ref >59)
Globulin, Total: 1.9 g/dL (ref 1.5–4.5)
Glucose: 92 mg/dL (ref 65–99)
Potassium: 4.5 mmol/L (ref 3.5–5.2)
SODIUM: 139 mmol/L (ref 134–144)
TOTAL PROTEIN: 6.6 g/dL (ref 6.0–8.5)

## 2014-05-08 LAB — CBC WITH DIFFERENTIAL/PLATELET
Basophils Absolute: 0 10*3/uL (ref 0.0–0.2)
Basos: 1 %
EOS: 1 %
Eosinophils Absolute: 0 10*3/uL (ref 0.0–0.4)
HEMATOCRIT: 43.3 % (ref 37.5–51.0)
Hemoglobin: 14.8 g/dL (ref 12.6–17.7)
IMMATURE GRANULOCYTES: 0 %
Immature Grans (Abs): 0 10*3/uL (ref 0.0–0.1)
Lymphocytes Absolute: 1 10*3/uL (ref 0.7–3.1)
Lymphs: 16 %
MCH: 30.3 pg (ref 26.6–33.0)
MCHC: 34.2 g/dL (ref 31.5–35.7)
MCV: 89 fL (ref 79–97)
MONOS ABS: 0.7 10*3/uL (ref 0.1–0.9)
Monocytes: 10 %
NEUTROS PCT: 72 %
Neutrophils Absolute: 4.9 10*3/uL (ref 1.4–7.0)
Platelets: 258 10*3/uL (ref 150–379)
RBC: 4.89 x10E6/uL (ref 4.14–5.80)
RDW: 13.8 % (ref 12.3–15.4)
WBC: 6.7 10*3/uL (ref 3.4–10.8)

## 2014-05-08 LAB — PHENOBARBITAL LEVEL: Phenobarbital Lvl: 18 ug/mL (ref 15–40)

## 2014-05-08 LAB — LAMOTRIGINE LEVEL: Lamotrigine Lvl: 9.5 ug/mL (ref 2.0–20.0)

## 2014-05-09 ENCOUNTER — Telehealth: Payer: Self-pay | Admitting: *Deleted

## 2014-05-09 ENCOUNTER — Encounter: Payer: Self-pay | Admitting: *Deleted

## 2014-05-09 NOTE — Telephone Encounter (Signed)
Called patient, received busy signal, letter will be mailed to patient with results.

## 2014-06-13 ENCOUNTER — Telehealth: Payer: Self-pay | Admitting: Neurology

## 2014-06-13 NOTE — Telephone Encounter (Signed)
I called the pharmacy.  Spoke with pharmacist who said the patient picked up a 90 days supply of this med on 03/28 at 3:50 pm, and it is not due again until June 28th, so it is refill too soon.  I called the patient back.  Phone rang over 20 times with no answer, no option to leave message.

## 2014-06-13 NOTE — Telephone Encounter (Signed)
I tried to call the patient again.  Got no answer, no option to leave message.  

## 2014-06-13 NOTE — Telephone Encounter (Signed)
I called the patient back again.  Got no answer.  No option to leave message.

## 2014-06-13 NOTE — Telephone Encounter (Signed)
Patient called back again [facility]. Please call and advise.

## 2014-06-13 NOTE — Telephone Encounter (Signed)
Patient states that he has yet to receive his script for lamoTRIgine (LAMICTAL) 200 MG tablet. He states that his insurance has not approved it yet. Please call and advice # [phone]

## 2014-06-14 NOTE — Telephone Encounter (Signed)
Called again.  Got no answer, no option to leave message. 

## 2014-06-14 NOTE — Telephone Encounter (Signed)
I called again.  Got no answer, unable to leave message.

## 2014-06-15 NOTE — Telephone Encounter (Signed)
I called again.  Got no answer, no option to leave message.  

## 2014-07-12 ENCOUNTER — Telehealth: Payer: Self-pay | Admitting: Neurology

## 2014-07-12 NOTE — Telephone Encounter (Signed)
I called again.  Got no answer.  Unable to leave message.  

## 2014-07-12 NOTE — Telephone Encounter (Signed)
Patient called and requested to speak with someone regarding his Rx. levETIRAcetam (KEPPRA) 750 MG tablet. He needs a refill on the medication and also has some questions about how many refills he has left on the medication. The bottle says one but he thinks it should be 3. Please call and advise.

## 2014-07-12 NOTE — Telephone Encounter (Signed)
A 1 year Rx was sent in March.  I called the pharmacy.  Spoke with Mellody DanceKeith.  He verified they do have the Rx from March.  Says the patient called them 3 days ago to get a refill on this med, and it has been ready for pick up for 3 days, but he has not been in to get it.  Thanked him for checking.  I called the patient back.  Got no answer.  No option to leave message.

## 2014-07-13 NOTE — Telephone Encounter (Signed)
Patient is calling and wants to speak with you again about the Rx levETIRAcetam (KEPPRA) 750 MG tablet. He states he has picked up the Rx but wants it called to another pharmacy as well.. Please call and discuss with the patient.Thank you.

## 2014-07-13 NOTE — Telephone Encounter (Signed)
I called again and spoke with patient.  Explained the pharmacy indicated he has refills on file, and med has been ready for pick up for several days.  He verbalized understanding.

## 2014-07-13 NOTE — Telephone Encounter (Signed)
I called again.  Got no answer.  Unable to leave message.  

## 2014-07-13 NOTE — Telephone Encounter (Signed)
I called back.  Got no answer.  Unable to leave message.

## 2014-10-25 ENCOUNTER — Ambulatory Visit: Payer: Medicare Other | Admitting: Adult Health

## 2014-10-31 ENCOUNTER — Other Ambulatory Visit: Payer: Self-pay | Admitting: Neurology

## 2014-10-31 DIAGNOSIS — R569 Unspecified convulsions: Secondary | ICD-10-CM

## 2015-02-02 ENCOUNTER — Other Ambulatory Visit: Payer: Self-pay | Admitting: Neurology

## 2015-02-05 ENCOUNTER — Telehealth: Payer: Self-pay | Admitting: Neurology

## 2015-02-05 MED ORDER — PHENOBARBITAL 97.2 MG PO TABS: 97.2000 mg | ORAL_TABLET | Freq: Every day | ORAL | Status: DC

## 2015-02-05 NOTE — Telephone Encounter (Signed)
Pt needs refill on PHENobarbital (LUMINAL) 97.2 MG tablet.may call [phone]

## 2015-02-05 NOTE — Telephone Encounter (Signed)
Dr Anne HahnWillis is out of the office.  Request [facility].   Patient will need to reschedule appt.  I called back, got no answer.  Left message.

## 2015-02-05 NOTE — Telephone Encounter (Signed)
Pt called sts he has 2 refills left but [facility] not [facility] and [facility] needs to call to find out where he can get medication. I relayed to pt he should call these pharmacies back as they can check with other stores to find out which store has medication and how soon it can be shipped to his neighborhood store. Pt made appt for 04/16/15.

## 2015-02-14 ENCOUNTER — Other Ambulatory Visit: Payer: Self-pay | Admitting: Diagnostic Neuroimaging

## 2015-02-15 ENCOUNTER — Other Ambulatory Visit: Payer: Self-pay

## 2015-02-15 MED ORDER — PHENOBARBITAL 97.2 MG PO TABS: 97.2000 mg | ORAL_TABLET | Freq: Every day | ORAL | Status: DC

## 2015-03-04 ENCOUNTER — Telehealth: Payer: Self-pay | Admitting: Adult Health

## 2015-03-04 NOTE — Telephone Encounter (Signed)
Patient called to request refill of PHENobarbital (LUMINAL) 97.2 MG tablet

## 2015-03-04 NOTE — Telephone Encounter (Signed)
It appears 30 days + 1 refill was sent to the pharmacy on 01/06.  I called the pharmacy.  They stated the patient already has refills on file, and they filled the Rx today.  They have spoken with the patient who will pick up Rx today.

## 2015-03-20 ENCOUNTER — Encounter: Payer: Self-pay | Admitting: *Deleted

## 2015-03-20 ENCOUNTER — Telehealth: Payer: Self-pay

## 2015-03-20 NOTE — Telephone Encounter (Signed)
PA for Keppra approved. Ref# [id number]  [facility] [address] [facility]  Spoke to pt and advised him that this pa came back approved.

## 2015-03-20 NOTE — Progress Notes (Signed)
Call[facility] They stated lamictal was accepted on 03/11/15 until 03/10/16. They gave quantity 90 tabs for 30 days. Next refill on 03/22/15. Spoke to [place] (rep). This is a non-formulary medication under pt insurance.

## 2015-04-16 ENCOUNTER — Ambulatory Visit: Payer: Self-pay | Admitting: Adult Health

## 2015-04-17 ENCOUNTER — Encounter: Payer: Self-pay | Admitting: Adult Health

## 2015-04-23 ENCOUNTER — Ambulatory Visit: Payer: Medicare Other | Admitting: Adult Health

## 2015-04-25 DIAGNOSIS — Z23 Encounter for immunization: Secondary | ICD-10-CM | POA: Diagnosis not present

## 2015-05-03 ENCOUNTER — Telehealth: Payer: Self-pay | Admitting: Neurology

## 2015-05-03 NOTE — Telephone Encounter (Signed)
Okay to schedule another revisit, if he misses one more, we will have to discharge him. He has a traumatic brain injury, lives alone, he needs assistance in keeping up his affairs.

## 2015-05-03 NOTE — Telephone Encounter (Signed)
Patient called to inquire if he has upcoming appointment. Patient advised, no appointments scheduled currently, advised he missed appointment on 04/23/15, patient advised this is his 3rd no show within this past year (04/23/15, 04/16/15, 10/25/14) and policy is after 3rd no show appointment, patient is subject to be dismissed. Patient acknowledged receiving letter that stated this. Advised patient that I will send message to his provider however I would be unable to reschedule appointment  at this time.

## 2015-05-06 ENCOUNTER — Telehealth: Payer: Self-pay | Admitting: *Deleted

## 2015-05-06 ENCOUNTER — Other Ambulatory Visit: Payer: Self-pay | Admitting: Neurology

## 2015-05-06 ENCOUNTER — Ambulatory Visit (INDEPENDENT_AMBULATORY_CARE_PROVIDER_SITE_OTHER): Payer: Medicare Other | Admitting: Neurology

## 2015-05-06 ENCOUNTER — Encounter: Payer: Self-pay | Admitting: Neurology

## 2015-05-06 VITALS — BP 126/69 | HR 62 | Ht 72.0 in | Wt 172.2 lb

## 2015-05-06 DIAGNOSIS — G40319 Generalized idiopathic epilepsy and epileptic syndromes, intractable, without status epilepticus: Secondary | ICD-10-CM

## 2015-05-06 DIAGNOSIS — Z5181 Encounter for therapeutic drug level monitoring: Secondary | ICD-10-CM

## 2015-05-06 MED ORDER — LAMOTRIGINE 200 MG PO TABS: ORAL_TABLET | ORAL | Status: DC

## 2015-05-06 MED ORDER — PHENOBARBITAL 97.2 MG PO TABS: 97.2000 mg | ORAL_TABLET | Freq: Every day | ORAL | Status: DC

## 2015-05-06 MED ORDER — LEVETIRACETAM 750 MG PO TABS: 750.0000 mg | ORAL_TABLET | Freq: Two times a day (BID) | ORAL | Status: DC

## 2015-05-06 NOTE — Telephone Encounter (Signed)
Appt made today at 12:00

## 2015-05-06 NOTE — Telephone Encounter (Signed)
Received fax confirmation

## 2015-05-06 NOTE — Telephone Encounter (Signed)
Pt called requesting brand for lamoTRIgine (LAMICTAL) 200 MG tablet and levETIRAcetam (KEPPRA) 750 MG tablet . This will need PA for brand.

## 2015-05-06 NOTE — Progress Notes (Signed)
Reason for visit: Seizures  Craig Fox is an 60 y.o. male  History of present illness:  Craig Fox is a 60 year old right-handed white male with a history of intractable epilepsy. The patient believes that he last had a seizure about 2 weeks ago coming out of sleep. The patient felt tired and out of it the next day. The patient otherwise has done fairly well with his seizure control. He does not operate a motor vehicle. He is on phenobarbital, Keppra, and Lamictal. He tolerates the medications well. He has to rely upon public transportation to get around. He lives by himself. He has missed the last 3 revisits. He returns to this office for an evaluation.  Past Medical History  Diagnosis Date  . Seizures (HCC)   . Depression   . 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 Traumatic brain injury (HCC)   . Gait disorder     Past Surgical History  Procedure Laterality Date  . Traumatic amputation finger Left     Ring finger    Family History  Problem Relation Age of Onset  . Mental illness Mother   . Heart attack Father     Social history:  reports that he has never smoked. He has never used smokeless tobacco. He reports that he does not drink alcohol or use illicit drugs.   No Known Allergies  Medications:  Prior to Admission medications   Medication Sig Start Date End Date Taking? Authorizing Provider  lamoTRIgine (LAMICTAL) 200 MG tablet One tablet in the morning and two tablets in the evening 05/07/14  Yes Butch Penny, NP  levETIRAcetam (KEPPRA) 750 MG tablet Take 1 tablet (750 mg total) by mouth 2 (two) times daily. 05/07/14  Yes Butch Penny, NP  PHENobarbital (LUMINAL) 97.2 MG tablet Take 1 tablet (97.2 mg total) by mouth at bedtime. 02/15/15  Yes York Spaniel, MD    ROS:  Out of a complete 14 system review of symptoms, the patient complains only of the following symptoms, and all other reviewed systems are  negative.  Seizures  Blood pressure 126/69, pulse 62, height 6' (1.829 m), weight 172 lb 4 oz (78.132 kg).  Physical Exam  General: The patient is alert and cooperative at the time of the examination.  Skin: No significant peripheral edema is noted.   Neurologic Exam  Mental status: The patient is alert and oriented x 3 at the time of the examination. The patient has apparent normal recent and remote memory, with an apparently normal attention span and concentration ability.   Cranial nerves: Facial symmetry is present. Speech is normal, no aphasia or dysarthria is noted. Extraocular movements are full. Visual fields are full.  Motor: The patient has good strength in all 4 extremities.  Sensory examination: Soft touch sensation is symmetric on the face, arms, and legs.  Coordination: The patient has good finger-nose-finger and heel-to-shin bilaterally.  Gait and station: The patient has a slight circumduction type gait with the left leg. Tandem gait is unsteady. Romberg is negative. No drift is seen.  Reflexes: Deep tendon reflexes are symmetric.   Assessment/Plan:  1. Intractable epilepsy  2. Gait disorder  The patient has difficulty following up to the office secondary to cognitive issues related to a prior closed head injury. He does not have a primary care physician. We will check blood work today, prescriptions for his medications were called in. He will follow-up in one year, sooner if needed.  C.  Craig SagoKeith Bradley Bostelman MD 05/06/2015 2:10 PM  [facility] [address] [place], [place] [id number]  Phone [phone] Fax [phone]

## 2015-05-06 NOTE — Patient Instructions (Signed)

## 2015-05-07 LAB — COMPREHENSIVE METABOLIC PANEL
A/G RATIO: 2 (ref 1.2–2.2)
ALT: 19 IU/L (ref 0–44)
AST: 25 IU/L (ref 0–40)
Albumin: 4.5 g/dL (ref 3.5–5.5)
Alkaline Phosphatase: 140 IU/L — ABNORMAL HIGH (ref 39–117)
BUN/Creatinine Ratio: 14 (ref 9–20)
BUN: 13 mg/dL (ref 6–24)
Bilirubin Total: 0.3 mg/dL (ref 0.0–1.2)
CALCIUM: 9.6 mg/dL (ref 8.7–10.2)
CHLORIDE: 102 mmol/L (ref 96–106)
CO2: 25 mmol/L (ref 18–29)
Creatinine, Ser: 0.95 mg/dL (ref 0.76–1.27)
GFR calc Af Amer: 101 mL/min/{1.73_m2} (ref >59)
GFR, EST NON AFRICAN AMERICAN: 87 mL/min/{1.73_m2} (ref >59)
GLUCOSE: 89 mg/dL (ref 65–99)
Globulin, Total: 2.3 g/dL (ref 1.5–4.5)
POTASSIUM: 4.5 mmol/L (ref 3.5–5.2)
Sodium: 140 mmol/L (ref 134–144)
Total Protein: 6.8 g/dL (ref 6.0–8.5)

## 2015-05-07 LAB — CBC WITH DIFFERENTIAL/PLATELET
BASOS ABS: 0 10*3/uL (ref 0.0–0.2)
BASOS: 0 %
EOS (ABSOLUTE): 0.1 10*3/uL (ref 0.0–0.4)
Eos: 1 %
Hematocrit: 41.1 % (ref 37.5–51.0)
Hemoglobin: 13.9 g/dL (ref 12.6–17.7)
IMMATURE GRANS (ABS): 0 10*3/uL (ref 0.0–0.1)
IMMATURE GRANULOCYTES: 0 %
LYMPHS: 19 %
Lymphocytes Absolute: 1.2 10*3/uL (ref 0.7–3.1)
MCH: 29.8 pg (ref 26.6–33.0)
MCHC: 33.8 g/dL (ref 31.5–35.7)
MCV: 88 fL (ref 79–97)
Monocytes Absolute: 0.6 10*3/uL (ref 0.1–0.9)
Monocytes: 9 %
NEUTROS PCT: 71 %
Neutrophils Absolute: 4.5 10*3/uL (ref 1.4–7.0)
PLATELETS: 242 10*3/uL (ref 150–379)
RBC: 4.67 x10E6/uL (ref 4.14–5.80)
RDW: 14 % (ref 12.3–15.4)
WBC: 6.4 10*3/uL (ref 3.4–10.8)

## 2015-05-07 LAB — LAMOTRIGINE LEVEL: LAMOTRIGINE LVL: 7.2 ug/mL (ref 2.0–20.0)

## 2015-05-07 LAB — PHENOBARBITAL LEVEL: PHENOBARBITAL, SERUM: 19 ug/mL (ref 15–40)

## 2015-05-07 NOTE — Telephone Encounter (Signed)
[phone]) and insurance company ([employer] plan [phone]- ID#[id number] [phone]- #[phone]) - both stated no PA is required.  He picked up both prescriptions from the pharmacy on 05/04/15.  The pharmacist and insurance company stated his copay is higher for brand name.  Insurance recommended he contact his insurance's member services dept (number will be on the back of his card) to inquire about his co-pay guidelines.  Left him a message with this information and him to call back if he had any further questions.

## 2015-05-09 NOTE — Telephone Encounter (Signed)
I called the patient. The patient appears to be requesting brand-name medications. If he has been on brand-name previously, we should continue this, the patient has been difficult to control with the seizures in the past. It also appears that the patient may want the prescription sent to another pharmacy, going to [facility]. I will try to call the patient back tomorrow.

## 2015-05-13 ENCOUNTER — Telehealth: Payer: Self-pay | Admitting: *Deleted

## 2015-05-13 NOTE — Telephone Encounter (Signed)
I spoke to [place] at the pharmacy.  [facility].  He has refills and no problem with getting medications thru insurance.  ([facility] meds).  Last filled 04-27-15 Lamictal and 3-15 with Keppra.  I tried getting in touch with pt.  LMVM for him that I called.

## 2015-05-13 NOTE — Telephone Encounter (Signed)
I called the patient. Apparently he is on brand-name Lamictal and Keppra, this was not listed in the computer, I will change this. I'll resubmit the prescriptions. The patient does have intractable seizures, they are difficult to control, brand-name would be preferred.

## 2015-05-13 NOTE — Telephone Encounter (Signed)
I attempted to call pharmacy on [address] and no answer.

## 2015-05-14 NOTE — Telephone Encounter (Signed)
I spoke to pt and relayed below.  I gave him Lili's name as a reference.  I spoke to her yesterday.  He received only 1/2 months supply of keppra.  Should be able to get other half.   I told him to call first and verify prior to driving over there. He is to call back if problems.

## 2015-05-14 NOTE — Telephone Encounter (Signed)
Pt called and says he believes he doesn't have the right number of refills. He believes he should have more refills. He seems a little confused about the number of refills on all his medication.

## 2015-05-15 ENCOUNTER — Telehealth: Payer: Self-pay | Admitting: Neurology

## 2015-05-15 NOTE — Telephone Encounter (Signed)
[facility] [place] [facility] [address]   He is able to get Keppra refill tomorrow.   Lamictal refill will be 05-28-15.  Pt is aware and verbalized understanding.

## 2015-05-17 ENCOUNTER — Telehealth: Payer: Self-pay | Admitting: Neurology

## 2015-05-17 NOTE — Telephone Encounter (Signed)
Received message from pt and requested for medication refill. Called pt back at 1:30pm and 2:30pm. The first time, somebody picked up the phone but not talking. The second time just keep ringing and not able to leave VM. Will try later.  Craig [name] Teegan Brandis, MD PhD Stroke Neurology 05/17/2015 2:38 PM

## 2015-05-17 NOTE — Telephone Encounter (Signed)
Recent RX were given for all of his medications. He has requested brand name medications.

## 2015-05-23 ENCOUNTER — Other Ambulatory Visit: Payer: Self-pay | Admitting: Adult Health

## 2015-05-27 NOTE — Telephone Encounter (Signed)
error 

## 2015-06-24 ENCOUNTER — Telehealth: Payer: Self-pay | Admitting: *Deleted

## 2015-06-24 NOTE — Telephone Encounter (Signed)
Spoke to pharmacy who said that they were low on requested med refill so they loaned pt #6 until shipment comes in tomorrow. Called pt back and let him know that he would be able to pick up remaining tabs tomorrow.

## 2015-06-24 NOTE — Telephone Encounter (Signed)
Pt called, please advise about the keppra refill. Please call [phone]

## 2015-11-24 DIAGNOSIS — S0990XA Unspecified injury of head, initial encounter: Secondary | ICD-10-CM | POA: Diagnosis not present

## 2015-11-24 DIAGNOSIS — S0181XA Laceration without foreign body of other part of head, initial encounter: Secondary | ICD-10-CM | POA: Diagnosis not present

## 2015-11-28 ENCOUNTER — Telehealth: Payer: Self-pay | Admitting: Diagnostic Neuroimaging

## 2015-11-28 NOTE — Telephone Encounter (Signed)
Patient called in for refill of phenobarbital. I [facility] month [facility] with 5 [facility]als [facility]. Patient plans to return in March 2018 for follow-up appointment.  Suanne MarkerVIKRAM R. Raymon Schlarb, MD 11/28/2015, 7:33 PM Certified in Neurology, Neurophysiology and Neuroimaging  [facility] [address], [address] [place], [place] [address] [phone]

## 2015-12-24 DIAGNOSIS — Z23 Encounter for immunization: Secondary | ICD-10-CM | POA: Diagnosis not present

## 2015-12-31 ENCOUNTER — Telehealth: Payer: Self-pay | Admitting: Adult Health

## 2015-12-31 NOTE — Telephone Encounter (Signed)
[facility] spoke with pharmacist who stated the refill of prescription is ready for patient to pick up.   Called patient and informed him his medication is ready for pick up. He verbalized understanding, appreciation.

## 2015-12-31 NOTE — Telephone Encounter (Signed)
Patient called to request refill of lamoTRIgine (LAMICTAL) 200 MG tablet

## 2016-03-26 ENCOUNTER — Encounter: Payer: Self-pay | Admitting: *Deleted

## 2016-03-26 NOTE — Progress Notes (Signed)
Submitted PA request for rx Keppra brand name only on covermymeds. Key #[id number].  It was approved as non-formulary from 12/27/15-03/26/17.

## 2016-03-30 ENCOUNTER — Telehealth: Payer: Self-pay | Admitting: *Deleted

## 2016-03-30 NOTE — Telephone Encounter (Signed)
Called and LVM for pt to call office. Received letter re: Keppra needing PA today.  **Please let patient know if he calls that: Submitted PA request for rx Keppra brand name only on covermymeds. Key #[id number].  It was approved as non-formulary from 12/27/15-03/26/17.

## 2016-03-31 ENCOUNTER — Other Ambulatory Visit: Payer: Self-pay | Admitting: *Deleted

## 2016-03-31 ENCOUNTER — Telehealth: Payer: Self-pay | Admitting: *Deleted

## 2016-03-31 NOTE — Telephone Encounter (Addendum)
Called [employer] re: rx lamictal. Received letter from them stating pt was sent temporary supply. Rx expired 03/10/16. Spoke with [employer]. She states this rx needs non-formulary exception. She will fax over questionarre to [phone].

## 2016-04-01 ENCOUNTER — Encounter: Payer: Self-pay | Admitting: Neurology

## 2016-04-01 NOTE — Telephone Encounter (Signed)
Called and spoke with Freida BusmanAllen from [employer]. Advised I have not received PA questionnaire yet. He states he does see request in computer. He verified fax number with me. He states we should be receiving fax soon.

## 2016-04-02 NOTE — Telephone Encounter (Signed)
Faxed letter written by CW,MD, dated 04/01/16 to [employer] re rx lamictal and need for him to stay on medication/dosing. Fax: [phone]. Received confirmation. Awaiting response.

## 2016-04-02 NOTE — Telephone Encounter (Signed)
Received fax notification from [facility]. PA lamictal approved for non-formulary. Approval good from 01/03/16-04/02/2017.

## 2016-04-07 NOTE — Telephone Encounter (Signed)
Pt called, said the lamictal is working and he grateful for RN and Dr W getting PA approved.

## 2016-04-07 NOTE — Telephone Encounter (Signed)
Dr. Willis- FYI 

## 2016-05-02 ENCOUNTER — Other Ambulatory Visit: Payer: Self-pay | Admitting: Neurology

## 2016-05-04 ENCOUNTER — Other Ambulatory Visit: Payer: Self-pay | Admitting: Neurology

## 2016-05-04 ENCOUNTER — Other Ambulatory Visit: Payer: Self-pay | Admitting: Diagnostic Neuroimaging

## 2016-05-04 NOTE — Telephone Encounter (Signed)
Faxed printed/signed rx phenobarbital to pt pharmacy. Fax: [phone]. Received confirmation.

## 2016-05-05 ENCOUNTER — Other Ambulatory Visit: Payer: Self-pay | Admitting: *Deleted

## 2016-05-05 ENCOUNTER — Encounter: Payer: Self-pay | Admitting: *Deleted

## 2016-05-05 ENCOUNTER — Ambulatory Visit: Payer: Medicare Other | Admitting: Adult Health

## 2016-05-05 NOTE — Telephone Encounter (Signed)
Called and spoke with pt. Advised CW,MD refilled Keppra. If he has any more episodes, he should call our office.  Advised again that PA Keppra and lamictal approved.  Called pt pharmacy. Spoke with Foye ClockKristina (tech) Advised PA Keppra approved.  She stated patient just filled phenobarbital 05/02/16 for $11.95 via the insurance. She is not sure if requiring PA. Not showing in their system. Too early to run another claim to see.

## 2016-05-18 ENCOUNTER — Telehealth: Payer: Self-pay | Admitting: Neurology

## 2016-05-18 NOTE — Telephone Encounter (Signed)
Patient requesting refills of Keprra. His best call back is [phone]

## 2016-05-18 NOTE — Telephone Encounter (Signed)
Tried calling pt again. Got busy signal

## 2016-05-18 NOTE — Telephone Encounter (Signed)
Tried calling patient again at number below. Got busy signal again. Unable to reach patient.

## 2016-05-18 NOTE — Telephone Encounter (Signed)
Tried calling pt back. Got busy signal.  Rx keppra refills [id number] [place],  - [address], 3 refills

## 2016-05-19 NOTE — Telephone Encounter (Signed)
Tried calling pt again at number provided, got busy signal.

## 2016-05-21 ENCOUNTER — Ambulatory Visit (INDEPENDENT_AMBULATORY_CARE_PROVIDER_SITE_OTHER): Payer: Medicare Other | Admitting: Adult Health

## 2016-05-21 ENCOUNTER — Encounter (INDEPENDENT_AMBULATORY_CARE_PROVIDER_SITE_OTHER): Payer: Self-pay

## 2016-05-21 ENCOUNTER — Encounter: Payer: Self-pay | Admitting: Adult Health

## 2016-05-21 VITALS — BP 148/64 | HR 66 | Ht 72.0 in | Wt 167.6 lb

## 2016-05-21 DIAGNOSIS — G40919 Epilepsy, unspecified, intractable, without status epilepticus: Secondary | ICD-10-CM

## 2016-05-21 DIAGNOSIS — Z5181 Encounter for therapeutic drug level monitoring: Secondary | ICD-10-CM | POA: Diagnosis not present

## 2016-05-21 NOTE — Progress Notes (Signed)
PATIENT: Craig Fox DOB: [birth year]  REASON FOR VISIT: follow up- seizures HISTORY FROM: patient  HISTORY OF PRESENT ILLNESS: Craig Fox is a 61 year old male with a history of intractable epilepsy. He is on Keppra, phenobarbital and Lamictal. He returns today for follow-up. He states that approximately 1 week ago he had a nocturnal seizure. He reports that this was not witnessed. He states that he "felt as if he had had a seizure." He states that he had had a fever a few days prior and this usually is a trigger for his seizure events according to the patient. He currently lives in an apartment alone. He does not operate a motor vehicle. Reports that he is able to complete all ADLs independently. He states that the night he had a seizure he cannot be sure that he did not miss any medication. He returns today for an evaluation.  HISTORY 05/06/15: Craig Fox is a 61 year old right-handed white male with a history of intractable epilepsy. The patient believes that he last had a seizure about 2 weeks ago coming out of sleep. The patient felt tired and out of it the next day. The patient otherwise has done fairly well with his seizure control. He does not operate a motor vehicle. He is on phenobarbital, Keppra, and Lamictal. He tolerates the medications well. He has to rely upon public transportation to get around. He lives by himself. He has missed the last 3 revisits. He returns to this office for an evaluation.  REVIEW OF SYSTEMS: Out of a complete 14 system review of symptoms, the patient complains only of the following symptoms, and all other reviewed systems are negative.  See history of present illness  ALLERGIES: No Known Allergies  HOME MEDICATIONS: Outpatient Medications Prior to Visit  Medication Sig Dispense Refill  . KEPPRA 750 MG tablet TAKE 1 TABLET (750 MG TOTAL) BY MOUTH 2 (TWO) TIMES DAILY. (FILLABLE 05/16/15) 180 tablet 3  . LAMICTAL 200 MG tablet ONE TABLET IN THE  MORNING AND TWO TABLETS IN THE EVENING 270 tablet 3  . PHENobarbital (LUMINAL) 97.2 MG tablet TAKE 1 TABLET BY MOUTH AT BEDTIME 90 tablet 3   No facility-administered medications prior to visit.     PAST MEDICAL HISTORY: Past Medical History:  Diagnosis Date  . Depression   . Gait disorder   . 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 Seizures (HCC)   . Traumatic brain injury [facility])     PAST SURGICAL HISTORY: Past Surgical History:  Procedure Laterality Date  . traumatic amputation finger Left    Ring finger    FAMILY HISTORY: Family History  Problem Relation Age of Onset  . Mental illness Mother   . Heart attack Father     SOCIAL HISTORY: Social History   Social History  . Marital status: Single    Spouse name: N/A  . Number of children: 0  . Years of education: hs   Occupational History  . disablilty    Social History Main Topics  . Smoking status: Never Smoker  . Smokeless tobacco: Never Used  . Alcohol use No  . Drug use: No  . Sexual activity: Not on file   Other Topics Concern  . Not on file   Social History Narrative  . No narrative on file      PHYSICAL EXAM  Vitals:   05/21/16 1120  BP: (!) 148/64  Pulse: 66  Weight: 167 lb 9.6 oz (76 kg)  Height: 6' (1.829 m)   Body mass index is 22.73 kg/m.  Generalized: Well developed, in no acute distress. Patient appears disheveled. Hair is not combed. Clothes appear dirty.   Neurological examination  Mentation: Alert oriented to time, place, history taking. Follows all commands speech and language fluent Cranial nerve II-XII: Pupils were equal round reactive to light. Extraocular movements were full, visual field were full on confrontational test. Facial sensation and strength were normal. Uvula tongue midline. Head turning and shoulder shrug  were normal and symmetric. Motor: The motor testing reveals 5 over 5 strength of all 4 extremities. Good symmetric  motor tone is noted throughout.  Sensory: Sensory testing is intact to soft touch on all 4 extremities. No evidence of extinction is noted.  Coordination: Cerebellar testing reveals good finger-nose-finger and heel-to-shin bilaterally.  Gait and station: Gait is slightly unsteady. Tandem gait not attempted. Romberg is negative. Reflexes: Deep tendon reflexes are symmetric and normal bilaterally.   DIAGNOSTIC DATA (LABS, IMAGING, TESTING) - I reviewed patient records, labs, notes, testing and imaging myself where available.  Lab Results  Component Value Date   WBC 6.4 05/06/2015   HGB 14.8 05/07/2014   HCT 41.1 05/06/2015   MCV 88 05/06/2015   PLT 242 05/06/2015      Component Value Date/Time   NA 140 05/06/2015 1253   K 4.5 05/06/2015 1253   CL 102 05/06/2015 1253   CO2 25 05/06/2015 1253   GLUCOSE 89 05/06/2015 1253   BUN 13 05/06/2015 1253   CREATININE 0.95 05/06/2015 1253   CALCIUM 9.6 05/06/2015 1253   PROT 6.8 05/06/2015 1253   ALBUMIN 4.5 05/06/2015 1253   AST 25 05/06/2015 1253   ALT 19 05/06/2015 1253   ALKPHOS 140 (H) 05/06/2015 1253   BILITOT 0.3 05/06/2015 1253   GFRNONAA 87 05/06/2015 1253   GFRAA 101 05/06/2015 1253      ASSESSMENT AND PLAN 61 y.o. 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 here with:  1. Intractable epilepsy  The patient will continue on phenobarbital, Lamictal and Keppra. I have advised that he should not miss any medication. I will check blood work today. Patient is advised that if he has any additional seizure events he should let us know. He will follow-up in 6 months or sooner if needed.  I spent 15 minutes with the patient 50% of this time was spent discussing his medication and seizure prevention.     Butch Penny, MSN, NP-C 05/21/2016, 11:45 AM [facility] [address], [address] [place], [place] [address] [phone]

## 2016-05-21 NOTE — Progress Notes (Signed)
I have read the note, and I agree with the clinical assessment and plan.  Mahek Schlesinger KEITH   

## 2016-05-21 NOTE — Patient Instructions (Signed)
Continue Lamictal, Phenobarbital and Keppra Blood work today If you have any seizure events please let us know.

## 2016-05-24 LAB — COMPREHENSIVE METABOLIC PANEL
ALT: 23 IU/L (ref 0–44)
AST: 28 IU/L (ref 0–40)
Albumin/Globulin Ratio: 2 (ref 1.2–2.2)
Albumin: 4.9 g/dL — ABNORMAL HIGH (ref 3.6–4.8)
Alkaline Phosphatase: 159 IU/L — ABNORMAL HIGH (ref 39–117)
BUN/Creatinine Ratio: 13 (ref 10–24)
BUN: 13 mg/dL (ref 8–27)
Bilirubin Total: <0.2 mg/dL (ref 0.0–1.2)
CALCIUM: 10 mg/dL (ref 8.6–10.2)
CO2: 23 mmol/L (ref 18–29)
CREATININE: 0.99 mg/dL (ref 0.76–1.27)
Chloride: 96 mmol/L (ref 96–106)
GFR calc Af Amer: 95 mL/min/{1.73_m2} (ref >59)
GFR, EST NON AFRICAN AMERICAN: 82 mL/min/{1.73_m2} (ref >59)
GLOBULIN, TOTAL: 2.4 g/dL (ref 1.5–4.5)
Glucose: 89 mg/dL (ref 65–99)
Potassium: 5.1 mmol/L (ref 3.5–5.2)
SODIUM: 140 mmol/L (ref 134–144)
TOTAL PROTEIN: 7.3 g/dL (ref 6.0–8.5)

## 2016-05-24 LAB — PHENOBARBITAL LEVEL: Phenobarbital, Serum: 18 ug/mL (ref 15–40)

## 2016-05-24 LAB — CBC WITH DIFFERENTIAL/PLATELET
BASOS: 1 %
Basophils Absolute: 0 10*3/uL (ref 0.0–0.2)
EOS (ABSOLUTE): 0.1 10*3/uL (ref 0.0–0.4)
EOS: 2 %
HEMATOCRIT: 40.8 % (ref 37.5–51.0)
Hemoglobin: 14.2 g/dL (ref 13.0–17.7)
IMMATURE GRANS (ABS): 0 10*3/uL (ref 0.0–0.1)
IMMATURE GRANULOCYTES: 0 %
LYMPHS: 21 %
Lymphocytes Absolute: 1.1 10*3/uL (ref 0.7–3.1)
MCH: 30.2 pg (ref 26.6–33.0)
MCHC: 34.8 g/dL (ref 31.5–35.7)
MCV: 87 fL (ref 79–97)
Monocytes Absolute: 0.4 10*3/uL (ref 0.1–0.9)
Monocytes: 8 %
NEUTROS PCT: 68 %
Neutrophils Absolute: 3.5 10*3/uL (ref 1.4–7.0)
Platelets: 222 10*3/uL (ref 150–379)
RBC: 4.7 x10E6/uL (ref 4.14–5.80)
RDW: 13.8 % (ref 12.3–15.4)
WBC: 5.1 10*3/uL (ref 3.4–10.8)

## 2016-05-24 LAB — LAMOTRIGINE LEVEL: Lamotrigine Lvl: 10.1 ug/mL (ref 2.0–20.0)

## 2016-05-24 LAB — LEVETIRACETAM LEVEL: LEVETIRACETAM: 19.6 ug/mL (ref 10.0–40.0)

## 2016-05-26 ENCOUNTER — Telehealth: Payer: Self-pay | Admitting: *Deleted

## 2016-05-26 NOTE — Telephone Encounter (Signed)
Attempted to call patient again. Phone immediately rings busy.

## 2016-05-26 NOTE — Telephone Encounter (Signed)
Attempted to call patient x 2. Phone immediately rings busy.

## 2016-05-28 ENCOUNTER — Encounter: Payer: Self-pay | Admitting: *Deleted

## 2016-05-28 NOTE — Telephone Encounter (Signed)
Phone immediately rings busy. No DPR on file. Will mail results to patient. Letter on NP's desk for review, signature.

## 2016-06-02 NOTE — Telephone Encounter (Signed)
Letter mailed yesterday with lab results.

## 2016-07-23 ENCOUNTER — Telehealth: Payer: Self-pay | Admitting: Adult Health

## 2016-07-23 DIAGNOSIS — R748 Abnormal levels of other serum enzymes: Secondary | ICD-10-CM

## 2016-07-23 NOTE — Telephone Encounter (Signed)
Revise note.

## 2016-07-23 NOTE — Telephone Encounter (Signed)
Please have patient come in for repeat blood work. His alkaline phosphatase was elevated at the last visit and we need to recheck this.

## 2016-07-23 NOTE — Telephone Encounter (Addendum)
Rn call emergency contact Craig Fox about pts phone not on or working. Rn stated pt needs to contact [facility] about lab work. Rn did not give her specific lab work she is not on dpr. She is only on emergency contact. Craig Fox stated pt does not drive because of history of seizures and he takes the bus to appts.[place]. Shirley stated pt lost his cell phone, and his home phone is cut off. She stated its probably cut off because of non payment. She was concern because no one had seen him, a well check was done. Per the police he was safe in his apartment. Rn requested that Craig Fox his emergency contact [facility] Craig Fox verbalized understanding, and will notify him. Message sent to [facility]egan NP.

## 2016-07-23 NOTE — Telephone Encounter (Signed)
Unable to reach patient for lab work repeat. Number was not working.

## 2016-11-19 ENCOUNTER — Encounter: Payer: Self-pay | Admitting: Neurology

## 2016-12-07 ENCOUNTER — Ambulatory Visit: Payer: Medicare Other | Admitting: Neurology

## 2016-12-14 ENCOUNTER — Other Ambulatory Visit: Payer: Self-pay | Admitting: Neurology

## 2016-12-15 ENCOUNTER — Other Ambulatory Visit: Payer: Self-pay | Admitting: Neurology

## 2016-12-15 NOTE — Telephone Encounter (Signed)
Faxed printed/signed rx [facility]obarbital to [facility] [address] at [phone]. Received fax confirmation.

## 2017-03-29 DIAGNOSIS — H0102A Squamous blepharitis right eye, upper and lower eyelids: Secondary | ICD-10-CM | POA: Diagnosis not present

## 2017-03-29 DIAGNOSIS — H0102B Squamous blepharitis left eye, upper and lower eyelids: Secondary | ICD-10-CM | POA: Diagnosis not present

## 2017-03-29 DIAGNOSIS — H2513 Age-related nuclear cataract, bilateral: Secondary | ICD-10-CM | POA: Diagnosis not present

## 2017-04-01 ENCOUNTER — Other Ambulatory Visit: Payer: Self-pay | Admitting: Neurology

## 2017-04-01 ENCOUNTER — Telehealth: Payer: Self-pay | Admitting: Neurology

## 2017-04-01 NOTE — Telephone Encounter (Signed)
[facility], [facility] said the pt is out of PHENobarbital (LUMINAL) 97.2 MG tablet. FYI

## 2017-04-01 NOTE — Telephone Encounter (Signed)
Rn tried to [facility]Maria [facility] was on [facility] for 8 minutes. Pharmacy pick up,and was told to hold on again. Rn was on hold again for 5 minutes. Rn could not hold any longer. Rn had to hang up. Message sent to Dr. Anne HahnWillis pt last seen 05/2016. Does not have follow up.Pt was sent a letter to call for appt. Message sent to Md if he want to refill med.

## 2017-04-02 NOTE — Telephone Encounter (Signed)
[facility] Received a receipt of confirmation.  

## 2017-04-07 ENCOUNTER — Telehealth: Payer: Self-pay | Admitting: Neurology

## 2017-04-07 ENCOUNTER — Other Ambulatory Visit: Payer: Self-pay | Admitting: Neurology

## 2017-04-07 MED ORDER — PHENOBARBITAL 97.2 MG PO TABS: ORAL_TABLET | ORAL | 0 refills | Status: DC

## 2017-04-07 NOTE — Telephone Encounter (Signed)
Pt stopped in office today requesting refill on phenobarbital. Appears rx printed 04/01/17. No note if rx faxed. CW,MD reprinted. [facility] [facility] pt request. Received fax confirmation.  Appt was made for f/u on 04/08/17 with MM,NP at 1:00pm per CW,MD request.

## 2017-04-07 NOTE — Telephone Encounter (Signed)
Pt requesting refill of phenobarbital -he is completely out.  Best call back. Best call back [phone]

## 2017-04-08 ENCOUNTER — Ambulatory Visit: Payer: Medicare Other | Admitting: Adult Health

## 2017-04-09 ENCOUNTER — Encounter: Payer: Self-pay | Admitting: Adult Health

## 2017-04-13 ENCOUNTER — Telehealth: Payer: Self-pay | Admitting: *Deleted

## 2017-04-13 NOTE — Telephone Encounter (Signed)
Submitted PA Lamictal 200mg  tablet on covermymeds. Patient has coverage for prescriptions via [employer] ([employer]). [id number]. Waiting on determination.   Your information has been submitted to [employer]. [employer] will review the request and will issue a decision, typically within 1-3 days from your submission.  If [employer] has not responded in 1-3 days or if you have any questions about your ePA request, please contact [employer] at [phone]."

## 2017-04-13 NOTE — Telephone Encounter (Signed)
PA approved as non-formulary exception 01/13/17-04/13/2018.

## 2017-04-15 ENCOUNTER — Telehealth: Payer: Self-pay | Admitting: Adult Health

## 2017-04-15 MED ORDER — LAMICTAL 200 MG PO TABS: ORAL_TABLET | ORAL | 3 refills | Status: AC

## 2017-04-15 NOTE — Telephone Encounter (Signed)
lamictal [facility]ill [facility] [address]

## 2017-04-15 NOTE — Telephone Encounter (Signed)
Pt has rescheduled his f/u appointment with NP Megan.  He is now asking if he can get a refill on his LAMICTAL 200 MG tablet [id number] [place], [place] - [address] [phone] (Phone) [phone] (Fax)

## 2017-04-15 NOTE — Addendum Note (Signed)
Addended by: [place] PinkHESSON, MARY C on: 04/15/2017 02:36 PM   Modules accepted: Orders

## 2017-04-20 ENCOUNTER — Emergency Department (HOSPITAL_COMMUNITY): Payer: Medicare Other

## 2017-04-20 ENCOUNTER — Inpatient Hospital Stay (HOSPITAL_COMMUNITY)
Admission: EM | Admit: 2017-04-20 | Discharge: 2017-04-22 | DRG: 101 | Disposition: A | Discharge status: Home / Self Care | Payer: Medicare Other | Attending: Internal Medicine | Admitting: Internal Medicine

## 2017-04-20 ENCOUNTER — Encounter (HOSPITAL_COMMUNITY): Payer: Self-pay | Admitting: Emergency Medicine

## 2017-04-20 DIAGNOSIS — Z87820 Personal history of traumatic brain injury: Secondary | ICD-10-CM

## 2017-04-20 DIAGNOSIS — W1830XA Fall on same level, unspecified, initial encounter: Secondary | ICD-10-CM | POA: Diagnosis present

## 2017-04-20 DIAGNOSIS — Z8249 Family history of ischemic heart disease and other diseases of the circulatory system: Secondary | ICD-10-CM

## 2017-04-20 DIAGNOSIS — R4182 Altered mental status, unspecified: Secondary | ICD-10-CM | POA: Diagnosis not present

## 2017-04-20 DIAGNOSIS — S01111A Laceration without foreign body of right eyelid and periocular area, initial encounter: Secondary | ICD-10-CM | POA: Diagnosis present

## 2017-04-20 DIAGNOSIS — R569 Unspecified convulsions: Secondary | ICD-10-CM

## 2017-04-20 DIAGNOSIS — R030 Elevated blood-pressure reading, without diagnosis of hypertension: Secondary | ICD-10-CM | POA: Diagnosis not present

## 2017-04-20 DIAGNOSIS — Z818 Family history of other mental and behavioral disorders: Secondary | ICD-10-CM | POA: Diagnosis not present

## 2017-04-20 DIAGNOSIS — G40919 Epilepsy, unspecified, intractable, without status epilepticus: Secondary | ICD-10-CM | POA: Diagnosis present

## 2017-04-20 DIAGNOSIS — S199XXA Unspecified injury of neck, initial encounter: Secondary | ICD-10-CM | POA: Diagnosis not present

## 2017-04-20 DIAGNOSIS — S0990XA Unspecified injury of head, initial encounter: Secondary | ICD-10-CM | POA: Diagnosis not present

## 2017-04-20 DIAGNOSIS — S0181XA Laceration without foreign body of other part of head, initial encounter: Secondary | ICD-10-CM | POA: Diagnosis not present

## 2017-04-20 DIAGNOSIS — G40419 Other generalized epilepsy and epileptic syndromes, intractable, without status epilepticus: Secondary | ICD-10-CM | POA: Diagnosis not present

## 2017-04-20 DIAGNOSIS — S0121XA Laceration without foreign body of nose, initial encounter: Secondary | ICD-10-CM | POA: Diagnosis not present

## 2017-04-20 DIAGNOSIS — R402441 Other coma, without documented Glasgow coma scale score, or with partial score reported, in the field [EMT or ambulance]: Secondary | ICD-10-CM | POA: Diagnosis not present

## 2017-04-20 DIAGNOSIS — S0993XA Unspecified injury of face, initial encounter: Secondary | ICD-10-CM | POA: Diagnosis not present

## 2017-04-20 DIAGNOSIS — Z23 Encounter for immunization: Secondary | ICD-10-CM | POA: Diagnosis not present

## 2017-04-20 DIAGNOSIS — R269 Unspecified abnormalities of gait and mobility: Secondary | ICD-10-CM | POA: Diagnosis present

## 2017-04-20 DIAGNOSIS — Z9114 Patient's other noncompliance with medication regimen: Secondary | ICD-10-CM

## 2017-04-20 DIAGNOSIS — G40909 Epilepsy, unspecified, not intractable, without status epilepticus: Secondary | ICD-10-CM | POA: Diagnosis not present

## 2017-04-20 DIAGNOSIS — G40319 Generalized idiopathic epilepsy and epileptic syndromes, intractable, without status epilepticus: Secondary | ICD-10-CM | POA: Diagnosis present

## 2017-04-20 LAB — CBC WITH DIFFERENTIAL/PLATELET
BASOS ABS: 0 10*3/uL (ref 0.0–0.1)
BASOS PCT: 0 %
EOS ABS: 0 10*3/uL (ref 0.0–0.7)
Eosinophils Relative: 0 %
HCT: 43.1 % (ref 39.0–52.0)
HEMOGLOBIN: 14.4 g/dL (ref 13.0–17.0)
Lymphocytes Relative: 12 %
Lymphs Abs: 0.6 10*3/uL — ABNORMAL LOW (ref 0.7–4.0)
MCH: 30.3 pg (ref 26.0–34.0)
MCHC: 33.4 g/dL (ref 30.0–36.0)
MCV: 90.5 fL (ref 78.0–100.0)
MONO ABS: 0.7 10*3/uL (ref 0.1–1.0)
MONOS PCT: 15 %
NEUTROS ABS: 3.7 10*3/uL (ref 1.7–7.7)
NEUTROS PCT: 73 %
Platelets: 217 10*3/uL (ref 150–400)
RBC: 4.76 MIL/uL (ref 4.22–5.81)
RDW: 13.8 % (ref 11.5–15.5)
WBC: 5.1 10*3/uL (ref 4.0–10.5)

## 2017-04-20 LAB — BASIC METABOLIC PANEL
ANION GAP: 11 (ref 5–15)
BUN: 16 mg/dL (ref 6–20)
CALCIUM: 9.3 mg/dL (ref 8.9–10.3)
CO2: 27 mmol/L (ref 22–32)
CREATININE: 1.04 mg/dL (ref 0.61–1.24)
Chloride: 98 mmol/L — ABNORMAL LOW (ref 101–111)
GFR calc non Af Amer: >60 mL/min (ref >60)
Glucose, Bld: 94 mg/dL (ref 65–99)
Potassium: 4 mmol/L (ref 3.5–5.1)
SODIUM: 136 mmol/L (ref 135–145)

## 2017-04-20 LAB — ETHANOL

## 2017-04-20 MED ORDER — LIDOCAINE-EPINEPHRINE (PF) 2 %-1:200000 IJ SOLN
10.0000 mL | Freq: Once | INTRAMUSCULAR | Status: AC
  Administered 2017-04-20: 10 mL via INTRADERMAL
  Filled 2017-04-20: qty 20

## 2017-04-20 MED ORDER — CEPHALEXIN 500 MG PO CAPS
500.0000 mg | ORAL_CAPSULE | Freq: Once | ORAL | Status: AC
  Administered 2017-04-20: 500 mg via ORAL
  Filled 2017-04-20: qty 1

## 2017-04-20 MED ORDER — LEVETIRACETAM IN NACL 1000 MG/100ML IV SOLN
1000.0000 mg | Freq: Once | INTRAVENOUS | Status: AC
  Administered 2017-04-20: 1000 mg via INTRAVENOUS
  Filled 2017-04-20: qty 100

## 2017-04-20 MED ORDER — BACITRACIN ZINC 500 UNIT/GM EX OINT
TOPICAL_OINTMENT | Freq: Two times a day (BID) | CUTANEOUS | Status: DC
  Administered 2017-04-20: 1 via TOPICAL
  Administered 2017-04-21: 22:00:00 via TOPICAL
  Administered 2017-04-21: 1 via TOPICAL
  Administered 2017-04-22: 09:00:00 via TOPICAL
  Filled 2017-04-20 (×13): qty 28.35
  Filled 2017-04-20: qty 0.9
  Filled 2017-04-20: qty 28.35
  Filled 2017-04-20: qty 1.8
  Filled 2017-04-20: qty 28.35
  Filled 2017-04-20: qty 1.8

## 2017-04-20 MED ORDER — LORAZEPAM 2 MG/ML IJ SOLN
1.0000 mg | Freq: Once | INTRAMUSCULAR | Status: AC
  Administered 2017-04-20: 1 mg via INTRAVENOUS
  Filled 2017-04-20: qty 1

## 2017-04-20 MED ORDER — TETANUS-DIPHTH-ACELL PERTUSSIS 5-2.5-18.5 LF-MCG/0.5 IM SUSP
0.5000 mL | Freq: Once | INTRAMUSCULAR | Status: AC
  Administered 2017-04-20: 0.5 mL via INTRAMUSCULAR
  Filled 2017-04-20 (×2): qty 0.5

## 2017-04-20 NOTE — ED Notes (Signed)
[facility] Expected date:  Expected time:  Means of arrival:  Comments: unresponsive

## 2017-04-20 NOTE — ED Notes (Signed)
Dr Erma Heritageisaacs at bedside doing wound care

## 2017-04-20 NOTE — ED Provider Notes (Addendum)
[facility]-EMERGENCY DEPT Provider Note   CSN: [id number] Arrival date & time: 04/20/17  1825     History   Chief Complaint Chief Complaint  Patient presents with  . Head Laceration    HPI Craig Fox is a 62 y.o. male.  HPI  62 year old male with history of seizure disorder, TBI, here with altered mental status.  The patient reportedly was found down in the parking lot of his apartment complex.  He had obvious trauma to the face.  He was confused and combative at the scene.  His fall was not witnessed.  In route, the patient has been increasingly more alert.  He has an extensive history of seizures and believes that he had one.  He did not bite his tongue.  He currently complains of facial pain.  Denies any difficulty breathing.  Denies any focal numbness or weakness.  He was well prior to the episode.  Denies any recent alcohol or other drug use.  He states he has been taking his medication as prescribed.  No other complaints.  He does not know when his last tetanus was.  He is oriented to person and place but not time at this time.   Level 5 caveat invoked as remainder of history, ROS, and physical exam limited due to patient's AMS.   Past Medical History:  Diagnosis Date  . Depression   . Gait disorder   . 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 Seizures (HCC)   . Traumatic brain injury [facility](HCC)     Patient Active Problem List   Diagnosis Date Noted  . 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 traumatic amputation finger Left    Ring finger       Home Medications    Prior to Admission medications   Medication Sig Start Date End Date Taking? Authorizing Provider  KEPPRA 750 MG tablet TAKE 1 TABLET (750 MG TOTAL) BY MOUTH 2 (TWO) TIMES DAILY. 04/02/17  Yes [name], [name], MD  LAMICTAL 200 MG tablet ONE TABLET IN  THE MORNING AND TWO TABLETS IN THE EVENING 04/15/17  Yes [name], [name], MD  PHENobarbital (LUMINAL) 97.2 MG tablet TAKE 1 TABLET BY MOUTH AT BEDTIME 04/07/17  Yes [name], [name], MD    Family History Family History  Problem Relation Age of Onset  . Mental illness Mother   . Heart attack Father     Social History Social History   Tobacco Use  . Smoking status: Never Smoker  . Smokeless tobacco: Never Used  Substance Use Topics  . Alcohol use: No  . Drug use: No     Allergies   Patient has no known allergies.   Review of Systems Review of Systems  Unable to perform ROS: Mental status change     Physical Exam Updated Vital Signs BP (!) 157/72 (BP Location: Left Arm)   Pulse 88   Temp 99.7 F (37.6 C) (Oral)   Resp 20   SpO2 98%   Physical Exam  Constitutional: He appears well-developed and well-nourished. No distress.  HENT:  Head: Normocephalic and atraumatic.  Multiple superficial linear lacerations to the right brow and mid forehead.  There is an irregular avulsion to the mid forehead just superior to the lacerations.  There is a curvilinear, small laceration to the nasal bridge.  No septal hematoma.  Oropharynx clear.  No obvious dental trauma  Eyes: Conjunctivae are normal.  Neck: Neck supple.  Cardiovascular: Normal rate, regular rhythm and normal heart sounds. Exam reveals no friction rub.  No murmur heard. Pulmonary/Chest: Effort normal and breath sounds normal. No respiratory distress. He has no wheezes. He has no rales.  Abdominal: He exhibits no distension.  Musculoskeletal: He exhibits no edema.  Neurological: He is alert. He exhibits normal muscle tone.  Skin: Skin is warm. Capillary refill takes less than 2 seconds.  Psychiatric: He has a normal mood and affect.  Nursing note and vitals reviewed.  Neurological Exam:  Mental Status: Alert but falls asleep easily. Oriented to person only. Attention and concentration normal. Speech clear. Recent  memory is impaired. Cranial Nerves: Visual fields grossly intact. EOMI and PERRLA. No nystagmus noted. Facial sensation intact at forehead, maxillary cheek, and chin/mandible bilaterally. No facial asymmetry or weakness. Hearing grossly normal. Uvula is midline, and palate elevates symmetrically. Normal SCM and trapezius strength. Tongue midline without fasciculations. Motor: Muscle strength 5/5 in proximal and distal UE and LE bilaterally. No pronator drift. Muscle tone normal. Reflexes: 2+ and symmetrical in all four extremities.  Sensation: Intact to light touch in upper and lower extremities distally bilaterally.  Gait: Normal without ataxia. Coordination: Normal FTN bilaterally.     ED Treatments / Results  Labs (all labs ordered are listed, but only abnormal results are displayed) Labs Reviewed  CBC WITH DIFFERENTIAL/PLATELET - Abnormal; Notable for the following components:      Result Value   Lymphs Abs 0.6 (*)    All other components within normal limits  BASIC METABOLIC PANEL - Abnormal; Notable for the following components:   Chloride 98 (*)    All other components within normal limits  ETHANOL    EKG Normal sinus rhythm. VR 81, QRS 79, QTc 453. No ST-t changes. No significant changes from prior.  Radiology Ct Head Wo Contrast  Result Date: 04/20/2017 CLINICAL DATA:  Found down.  Trauma EXAM: CT HEAD WITHOUT CONTRAST CT MAXILLOFACIAL WITHOUT CONTRAST CT CERVICAL SPINE WITHOUT CONTRAST TECHNIQUE: Multidetector CT imaging of the head, cervical spine, and maxillofacial structures were performed using the standard protocol without intravenous contrast. Multiplanar CT image reconstructions of the cervical spine and maxillofacial structures were also generated. COMPARISON:  CT head 06/25/2005 FINDINGS: CT HEAD FINDINGS Brain: Generalized atrophy and chronic microvascular ischemia in the white matter. Benign basal ganglia cysts bilaterally. Negative for acute infarct, hemorrhage,  mass Vascular: Negative for hyperdense vessel Skull: Negative Other: Mild mucosal edema paranasal sinuses bilaterally. CT MAXILLOFACIAL FINDINGS Osseous: Negative for facial fracture Poor dentition with caries. Lucency around left upper third molar with extensive caries. Orbits: Normal orbit. No soft tissue swelling or mass in the orbit. Sinuses: Mild mucosal edema in the maxillary sinus bilaterally. No air-fluid level. Soft tissues: Soft tissue swelling superior to the right orbit compatible with contusion. CT CERVICAL SPINE FINDINGS Alignment: Normal Skull base and vertebrae: Negative for fracture Soft tissues and spinal canal: Negative Disc levels: Disc degeneration and spurring in the cervical spine most prominent C5-6 and C6-7. This causes mild spinal and foraminal stenosis bilaterally. Upper chest: Negative Other: None IMPRESSION: 1. Atrophy and chronic microvascular ischemia.  No acute abnormality 2. Negative for facial fracture. Soft tissue swelling superior to the right orbit compatible with contusion 3. Negative for cervical spine fracture. Cervical spondylosis C5-6 and C6-7 Electronically Signed   By: Marlan Palau M.D.   On: 04/20/2017 20:47   Ct Cervical Spine Wo Contrast  Result Date: 04/20/2017 CLINICAL DATA:  Found down.  Trauma EXAM: CT HEAD WITHOUT CONTRAST CT MAXILLOFACIAL WITHOUT CONTRAST CT CERVICAL SPINE WITHOUT CONTRAST TECHNIQUE: Multidetector CT imaging of the head, cervical spine, and maxillofacial structures were performed using the standard protocol without intravenous contrast. Multiplanar CT image reconstructions of the cervical spine and maxillofacial structures were also generated. COMPARISON:  CT head 06/25/2005 FINDINGS: CT HEAD FINDINGS Brain: Generalized atrophy and chronic microvascular ischemia in the white matter. Benign basal ganglia cysts bilaterally. Negative for acute infarct, hemorrhage, mass Vascular: Negative for hyperdense vessel Skull: Negative Other: Mild mucosal  edema paranasal sinuses bilaterally. CT MAXILLOFACIAL FINDINGS Osseous: Negative for facial fracture Poor dentition with caries. Lucency around left upper third molar with extensive caries. Orbits: Normal orbit. No soft tissue swelling or mass in the orbit. Sinuses: Mild mucosal edema in the maxillary sinus bilaterally. No air-fluid level. Soft tissues: Soft tissue swelling superior to the right orbit compatible with contusion. CT CERVICAL SPINE FINDINGS Alignment: Normal Skull base and vertebrae: Negative for fracture Soft tissues and spinal canal: Negative Disc levels: Disc degeneration and spurring in the cervical spine most prominent C5-6 and C6-7. This causes mild spinal and foraminal stenosis bilaterally. Upper chest: Negative Other: None IMPRESSION: 1. Atrophy and chronic microvascular ischemia.  No acute abnormality 2. Negative for facial fracture. Soft tissue swelling superior to the right orbit compatible with contusion 3. Negative for cervical spine fracture. Cervical spondylosis C5-6 and C6-7 Electronically Signed   By: Marlan Palau M.D.   On: 04/20/2017 20:47   Ct Maxillofacial Wo Contrast  Result Date: 04/20/2017 CLINICAL DATA:  Found down.  Trauma EXAM: CT HEAD WITHOUT CONTRAST CT MAXILLOFACIAL WITHOUT CONTRAST CT CERVICAL SPINE WITHOUT CONTRAST TECHNIQUE: Multidetector CT imaging of the head, cervical spine, and maxillofacial structures were performed using the standard protocol without intravenous contrast. Multiplanar CT image reconstructions of the cervical spine and maxillofacial structures were also generated. COMPARISON:  CT head 06/25/2005 FINDINGS: CT HEAD FINDINGS Brain: Generalized atrophy and chronic microvascular ischemia in the white matter. Benign basal ganglia cysts bilaterally. Negative for acute infarct, hemorrhage, mass Vascular: Negative for hyperdense vessel Skull: Negative Other: Mild mucosal edema paranasal sinuses bilaterally. CT MAXILLOFACIAL FINDINGS Osseous: Negative  for facial fracture Poor dentition with caries. Lucency around left upper third molar with extensive caries. Orbits: Normal orbit. No soft tissue swelling or mass in the orbit. Sinuses: Mild mucosal edema in the maxillary sinus bilaterally. No air-fluid level. Soft tissues: Soft tissue swelling superior to the right orbit compatible with contusion. CT CERVICAL SPINE FINDINGS Alignment: Normal Skull base and vertebrae: Negative for fracture Soft tissues and spinal canal: Negative Disc levels: Disc degeneration and spurring in the cervical spine most prominent C5-6 and C6-7. This causes mild spinal and foraminal stenosis bilaterally. Upper chest: Negative Other: None IMPRESSION: 1. Atrophy and chronic microvascular ischemia.  No acute abnormality 2. Negative for facial fracture. Soft tissue swelling superior to the right orbit compatible with contusion 3. Negative for cervical spine fracture. Cervical spondylosis C5-6 and C6-7 Electronically Signed   By: Marlan Palau M.D.   On: 04/20/2017 20:47    Procedures .Marland KitchenLaceration Repair Date/Time: 04/20/2017 8:23 PM Performed by: Shaune Pollack, MD Authorized by: Shaune Pollack, MD   Consent:    Consent obtained:  Verbal   Consent given by:  Patient   Risks discussed:  Infection, need for additional repair, pain, tendon damage, retained foreign body, vascular damage, poor cosmetic result, poor wound healing and nerve damage   Alternatives discussed:  Referral and delayed treatment Anesthesia (see  MAR for exact dosages):    Anesthesia method:  Local infiltration   Local anesthetic:  Lidocaine 1% WITH epi Laceration details:    Location: Nasal bridge.   Length (cm):  1 Repair type:    Repair type:  Simple Pre-procedure details:    Preparation:  Patient was prepped and draped in usual sterile fashion and imaging obtained to evaluate for foreign bodies Exploration:    Hemostasis achieved with:  Direct pressure   Wound exploration: wound explored  through full range of motion and entire depth of wound probed and visualized   Treatment:    Area cleansed with:  Betadine   Amount of cleaning:  Extensive   Irrigation solution:  Sterile water   Irrigation method:  Pressure wash Skin repair:    Repair method:  Sutures   Suture size:  5-0   Suture material:  Fast-absorbing gut   Suture technique:  Simple interrupted   Number of sutures:  1 Approximation:    Approximation:  Close   Vermilion border: well-aligned   Post-procedure details:    Dressing:  Antibiotic ointment   Patient tolerance of procedure:  Tolerated well, no immediate complications .Marland KitchenLaceration Repair Date/Time: 04/20/2017 8:24 PM Performed by: Shaune Pollack, MD Authorized by: Shaune Pollack, MD   Consent:    Consent obtained:  Verbal   Consent given by:  Patient   Risks discussed:  Infection, need for additional repair, pain, tendon damage, retained foreign body, vascular damage, poor cosmetic result, poor wound healing and nerve damage   Alternatives discussed:  Referral and delayed treatment Anesthesia (see MAR for exact dosages):    Anesthesia method:  Local infiltration   Local anesthetic:  Lidocaine 1% WITH epi Laceration details:    Location: Right brow.   Length (cm):  2 Repair type:    Repair type:  Simple Pre-procedure details:    Preparation:  Patient was prepped and draped in usual sterile fashion and imaging obtained to evaluate for foreign bodies Exploration:    Hemostasis achieved with:  Direct pressure   Wound exploration: wound explored through full range of motion and entire depth of wound probed and visualized   Treatment:    Area cleansed with:  Betadine   Amount of cleaning:  Extensive   Irrigation solution:  Sterile water   Irrigation method:  Pressure wash Skin repair:    Repair method:  Sutures   Suture size:  5-0   Suture material:  Fast-absorbing gut   Suture technique:  Simple interrupted   Number of sutures:   4 Approximation:    Approximation:  Close   Vermilion border: well-aligned   Post-procedure details:    Dressing:  Antibiotic ointment   Patient tolerance of procedure:  Tolerated well, no immediate complications .Marland KitchenLaceration Repair Date/Time: 04/20/2017 8:25 PM Performed by: Shaune Pollack, MD Authorized by: Shaune Pollack, MD   Consent:    Consent obtained:  Verbal   Consent given by:  Patient   Risks discussed:  Infection, need for additional repair, pain, tendon damage, retained foreign body, vascular damage, poor cosmetic result, poor wound healing and nerve damage   Alternatives discussed:  Referral and delayed treatment Anesthesia (see MAR for exact dosages):    Anesthesia method:  Local infiltration   Local anesthetic:  Lidocaine 1% WITH epi Laceration details:    Location: Mid forehead, just superior to brow.   Length (cm):  5 Repair type:    Repair type:  Simple Pre-procedure details:    Preparation:  Patient  was prepped and draped in usual sterile fashion and imaging obtained to evaluate for foreign bodies Exploration:    Hemostasis achieved with:  Direct pressure   Wound exploration: wound explored through full range of motion and entire depth of wound probed and visualized   Treatment:    Area cleansed with:  Betadine   Amount of cleaning:  Extensive   Irrigation solution:  Sterile water   Irrigation method:  Pressure wash Skin repair:    Repair method:  Sutures   Suture size:  5-0   Suture material:  Fast-absorbing gut   Suture technique:  Simple interrupted   Number of sutures:  7 Approximation:    Approximation:  Close   Vermilion border: well-aligned   Post-procedure details:    Dressing:  Antibiotic ointment   Patient tolerance of procedure:  Tolerated well, no immediate complications .Marland KitchenLaceration Repair Date/Time: 04/20/2017 8:25 PM Performed by: Shaune Pollack, MD Authorized by: Shaune Pollack, MD   Consent:    Consent obtained:  Verbal    Consent given by:  Patient   Risks discussed:  Infection, need for additional repair, pain, tendon damage, retained foreign body, vascular damage, poor cosmetic result, poor wound healing and nerve damage   Alternatives discussed:  Referral and delayed treatment Anesthesia (see MAR for exact dosages):    Anesthesia method:  Local infiltration   Local anesthetic:  Lidocaine 1% WITH epi Laceration details:    Location: Mid forehead.   Length (cm):  4 Repair type:    Repair type:  Intermediate Pre-procedure details:    Preparation:  Patient was prepped and draped in usual sterile fashion and imaging obtained to evaluate for foreign bodies Exploration:    Hemostasis achieved with:  Direct pressure   Wound exploration: wound explored through full range of motion and entire depth of wound probed and visualized   Treatment:    Area cleansed with:  Betadine   Amount of cleaning:  Extensive   Irrigation solution:  Sterile water   Irrigation method:  Pressure wash Skin repair:    Repair method:  Sutures   Suture material:  Fast-absorbing gut   Suture technique:  Simple interrupted   Number of sutures:  6 Approximation:    Approximation:  Close   Vermilion border: well-aligned   Post-procedure details:    Dressing:  Antibiotic ointment   Patient tolerance of procedure:  Tolerated well, no immediate complications Comments:     Devitalized avulsed tissue was sharply excised.   (including critical care time)  Medications Ordered in ED Medications  bacitracin ointment (1 application Topical Given 04/20/17 2257)  Tdap (BOOSTRIX) injection 0.5 mL (0.5 mLs Intramuscular Given 04/20/17 2057)  LORazepam (ATIVAN) injection 1 mg (1 mg Intravenous Given 04/20/17 2101)  lidocaine-EPINEPHrine (XYLOCAINE W/EPI) 2 %-1:200000 (PF) injection 10 mL (10 mLs Intradermal Given by Other 04/20/17 1907)  cephALEXin (KEFLEX) capsule 500 mg (500 mg Oral Given 04/20/17 2103)  levETIRAcetam (KEPPRA) IVPB 1000 mg/100  mL premix (0 mg Intravenous Stopped 04/20/17 2233)     Initial Impression / Assessment and Plan / ED Course  I have reviewed the triage vital signs and the nursing notes.  Pertinent labs & imaging results that were available during my care of the patient were reviewed by me and considered in my medical decision making (see chart for details).     62 year old male with history of seizure disorder, TBI, here with suspected seizure and postictal state.  CT head and C-spine negative.  No facial fractures.  Lacerations were repaired as above.  Will start him on empiric Keflex given that it was a fairly complex wound and it is unclear when he sustained it.  Otherwise, patient remains postictal and confused.  While not sure of his baseline, he remains drowsy and has not returned to his baseline despite multiple hours of observation.  Given that he lives alone with fair to return to baseline, feels reasonable to observe him in the hospital.  Will consult tele-neurology with plan to follow-up the recommendations. Pt given IV Keppra, ativan here.  Patient care transferred to Dr. Nicanor Alcon at the end of my shift. Patient presentation, ED course, and plan of care discussed with review of all pertinent labs and imaging. Please see his/her note for further details regarding further ED course and disposition.   Final Clinical Impressions(s) / ED Diagnoses   Final diagnoses:  Breakthrough seizure (HCC)  Facial laceration, initial encounter  Laceration of nose, initial encounter    ED Discharge Orders    None       Shaune Pollack, MD 04/21/17 Buddy Duty    Shaune Pollack, MD 05/03/17 1224

## 2017-04-20 NOTE — ED Triage Notes (Addendum)
Per EMS-states he was found down [place] his apartment [place]-possibly intoxicated and/or had a seizure-combative when EMS arrived-states he was given Haldol 5 mg IM in route-states since arriving to ED patient is more alert, answering questions-oriented X2-laceration above right eye and bridge of nose-CBG 172

## 2017-04-20 NOTE — ED Notes (Signed)
Patient transported to CT 

## 2017-04-21 ENCOUNTER — Other Ambulatory Visit: Payer: Self-pay

## 2017-04-21 ENCOUNTER — Observation Stay (HOSPITAL_COMMUNITY): Payer: Medicare Other

## 2017-04-21 ENCOUNTER — Encounter (HOSPITAL_COMMUNITY): Payer: Self-pay | Admitting: Internal Medicine

## 2017-04-21 DIAGNOSIS — R030 Elevated blood-pressure reading, without diagnosis of hypertension: Secondary | ICD-10-CM | POA: Diagnosis not present

## 2017-04-21 DIAGNOSIS — Z87820 Personal history of traumatic brain injury: Secondary | ICD-10-CM | POA: Diagnosis not present

## 2017-04-21 DIAGNOSIS — G40919 Epilepsy, unspecified, intractable, without status epilepticus: Secondary | ICD-10-CM | POA: Diagnosis not present

## 2017-04-21 DIAGNOSIS — S0121XA Laceration without foreign body of nose, initial encounter: Secondary | ICD-10-CM | POA: Diagnosis present

## 2017-04-21 DIAGNOSIS — G40419 Other generalized epilepsy and epileptic syndromes, intractable, without status epilepticus: Secondary | ICD-10-CM | POA: Diagnosis present

## 2017-04-21 DIAGNOSIS — R4020 Unspecified coma: Secondary | ICD-10-CM | POA: Diagnosis not present

## 2017-04-21 DIAGNOSIS — Z8249 Family history of ischemic heart disease and other diseases of the circulatory system: Secondary | ICD-10-CM | POA: Diagnosis not present

## 2017-04-21 DIAGNOSIS — S01111A Laceration without foreign body of right eyelid and periocular area, initial encounter: Secondary | ICD-10-CM | POA: Diagnosis present

## 2017-04-21 DIAGNOSIS — R269 Unspecified abnormalities of gait and mobility: Secondary | ICD-10-CM | POA: Diagnosis present

## 2017-04-21 DIAGNOSIS — W1830XA Fall on same level, unspecified, initial encounter: Secondary | ICD-10-CM | POA: Diagnosis present

## 2017-04-21 DIAGNOSIS — Z9114 Patient's other noncompliance with medication regimen: Secondary | ICD-10-CM | POA: Diagnosis not present

## 2017-04-21 DIAGNOSIS — R569 Unspecified convulsions: Secondary | ICD-10-CM

## 2017-04-21 DIAGNOSIS — S0181XA Laceration without foreign body of other part of head, initial encounter: Secondary | ICD-10-CM

## 2017-04-21 DIAGNOSIS — R4182 Altered mental status, unspecified: Secondary | ICD-10-CM | POA: Diagnosis not present

## 2017-04-21 DIAGNOSIS — Z818 Family history of other mental and behavioral disorders: Secondary | ICD-10-CM | POA: Diagnosis not present

## 2017-04-21 DIAGNOSIS — Z23 Encounter for immunization: Secondary | ICD-10-CM | POA: Diagnosis not present

## 2017-04-21 LAB — CBC WITH DIFFERENTIAL/PLATELET
BASOS ABS: 0 10*3/uL (ref 0.0–0.1)
BASOS PCT: 0 %
Eosinophils Absolute: 0 10*3/uL (ref 0.0–0.7)
Eosinophils Relative: 0 %
HEMATOCRIT: 42.3 % (ref 39.0–52.0)
HEMOGLOBIN: 14.2 g/dL (ref 13.0–17.0)
LYMPHS PCT: 12 %
Lymphs Abs: 0.8 10*3/uL (ref 0.7–4.0)
MCH: 30.3 pg (ref 26.0–34.0)
MCHC: 33.6 g/dL (ref 30.0–36.0)
MCV: 90.4 fL (ref 78.0–100.0)
Monocytes Absolute: 1 10*3/uL (ref 0.1–1.0)
Monocytes Relative: 16 %
NEUTROS ABS: 4.6 10*3/uL (ref 1.7–7.7)
NEUTROS PCT: 72 %
Platelets: 220 10*3/uL (ref 150–400)
RBC: 4.68 MIL/uL (ref 4.22–5.81)
RDW: 14.1 % (ref 11.5–15.5)
WBC: 6.5 10*3/uL (ref 4.0–10.5)

## 2017-04-21 LAB — COMPREHENSIVE METABOLIC PANEL
ALBUMIN: 3.8 g/dL (ref 3.5–5.0)
ALK PHOS: 133 U/L — AB (ref 38–126)
ALT: 18 U/L (ref 17–63)
AST: 28 U/L (ref 15–41)
Anion gap: 10 (ref 5–15)
BUN: 16 mg/dL (ref 6–20)
CHLORIDE: 100 mmol/L — AB (ref 101–111)
CO2: 23 mmol/L (ref 22–32)
CREATININE: 0.89 mg/dL (ref 0.61–1.24)
Calcium: 8.5 mg/dL — ABNORMAL LOW (ref 8.9–10.3)
GFR calc non Af Amer: >60 mL/min (ref >60)
GLUCOSE: 100 mg/dL — AB (ref 65–99)
Potassium: 4 mmol/L (ref 3.5–5.1)
SODIUM: 133 mmol/L — AB (ref 135–145)
Total Bilirubin: 0.9 mg/dL (ref 0.3–1.2)
Total Protein: 6.7 g/dL (ref 6.5–8.1)

## 2017-04-21 LAB — CK: CK TOTAL: 118 U/L (ref 49–397)

## 2017-04-21 LAB — PHENOBARBITAL LEVEL: Phenobarbital: 13.6 ug/mL — ABNORMAL LOW (ref 15.0–30.0)

## 2017-04-21 LAB — HIV ANTIBODY (ROUTINE TESTING W REFLEX): HIV Screen 4th Generation wRfx: NONREACTIVE

## 2017-04-21 MED ORDER — ACETAMINOPHEN 325 MG PO TABS: 650.0000 mg | ORAL_TABLET | Freq: Four times a day (QID) | ORAL | Status: DC | PRN

## 2017-04-21 MED ORDER — PHENOBARBITAL 97.2 MG PO TABS
97.2000 mg | ORAL_TABLET | Freq: Every day | ORAL | Status: DC
  Administered 2017-04-21: 97.2 mg via ORAL
  Filled 2017-04-21: qty 3

## 2017-04-21 MED ORDER — LAMOTRIGINE 100 MG PO TABS
200.0000 mg | ORAL_TABLET | Freq: Every day | ORAL | Status: DC
  Administered 2017-04-21 – 2017-04-22 (×2): 200 mg via ORAL
  Filled 2017-04-21 (×2): qty 2

## 2017-04-21 MED ORDER — SODIUM CHLORIDE 0.9 % IV SOLN
INTRAVENOUS | Status: AC
  Administered 2017-04-21: 06:00:00 via INTRAVENOUS

## 2017-04-21 MED ORDER — LEVETIRACETAM 500 MG PO TABS
750.0000 mg | ORAL_TABLET | Freq: Two times a day (BID) | ORAL | Status: DC
  Administered 2017-04-21 – 2017-04-22 (×3): 750 mg via ORAL
  Filled 2017-04-21 (×3): qty 1

## 2017-04-21 MED ORDER — ONDANSETRON HCL 4 MG PO TABS: 4.0000 mg | ORAL_TABLET | Freq: Four times a day (QID) | ORAL | Status: DC | PRN

## 2017-04-21 MED ORDER — HYDRALAZINE HCL 20 MG/ML IJ SOLN: 10.0000 mg | INTRAMUSCULAR | Status: DC | PRN

## 2017-04-21 MED ORDER — ACETAMINOPHEN 650 MG RE SUPP: 650.0000 mg | Freq: Four times a day (QID) | RECTAL | Status: DC | PRN

## 2017-04-21 MED ORDER — ONDANSETRON HCL 4 MG/2ML IJ SOLN: 4.0000 mg | Freq: Four times a day (QID) | INTRAMUSCULAR | Status: DC | PRN

## 2017-04-21 NOTE — Consult Note (Signed)
   [facility] TeleNeurology Consult Services  Impression: 62 y/o M h/o epilepsy -on keppra and PBT at home -found down at home on ground, combative and confused with facial lacerations -CT head NEG for acute lesions -pt still some what lethargic but endorses med compliance, denies drug or etoh use  -exam is non focal  Comments:   [facility] contacted: 101  [facility] at bedside: 105 NIHSS assessment time: 108  Recommendations:  -admit for obs, seizure eval -load keppra 1 gram -f/u PBT and keppra levels -restart home doses for now -drug and etoh screens   Inpatient neurology consultation Inpatient stroke evaluation as per Neurology/ Internal Medicine Discussed with ED MD Please call with questions  ----------------------------------------------------------------------------------------- Medical Decision Making:  - Extensive number of diagnosis or management options are considered above.   - Extensive amount of complex data reviewed.   - High risk of complication and/or morbidity or mortality are associated with differential diagnostic considerations above.  - There may be Uncertain outcome and increased probability of prolonged functional impairment or high probability of severe prolonged functional impairment associated with some of these differential diagnosis.  Medical Data Reviewed:  1.Data reviewed include clinical labs, radiology,  Medical Tests;   2.Tests results discussed w/performing or interpreting physician;   3.Obtaining/reviewing old medical records;  4.Obtaining case history from another source;  5.Independent review of image, tracing or specimen.    Patient was informed the Neurology Consult would happen via telehealth (remote video) and consented to receiving care in this manner.

## 2017-04-21 NOTE — Progress Notes (Signed)
62 year old male with a history of seizure admitted with breakthrough seizures.  Patient reported that he has been compliant with his medications.  He had recently come to the ER a few days ago with the same episode of seizure.  Patient was admitted early this morning by my partner.  In the ER he had CT of the head and C-spine showed condition in the frontal area.  Telemetry neurologist was consulted and recommended neurology consult in the morning.  He was given a loading dose of Keppra and admitted for further management.  His labs on admission electrolytes potassium was normal calcium was normal.  Creatinine was 1.04 which is down to 0.89 today.  Liver function tests are normal.  White count was normal.  CPK was normal.  Phenobarbital level was 13.6 alcohol level was less than 10 MRI of the brain that was done today shows no acute finding atrophy and moderate chronic small vessel ischemic changes noted.  Patient currently on Lamictal 200 mg daily, Keppra 750 mg twice a day, phenobarbital 97.2 mg nightly.  I will call neurology and discuss his case.

## 2017-04-21 NOTE — ED Notes (Signed)
Pt transported to MRI 

## 2017-04-21 NOTE — ED Notes (Signed)
ED TO INPATIENT HANDOFF REPORT  Name/Age/Gender Craig Fox 62 y.o. male  Code Status    Code Status Orders  (From admission, onward)        Start     Ordered   04/21/17 0421  Full code  Continuous     04/21/17 0423    Code Status History    Date Active Date Inactive Code Status Order ID Comments User Context   This patient has a current code status but no historical code status.      Home/SNF/Other Home  Chief Complaint Head Laceration; Altered Mental Status  Level of Care/Admitting Diagnosis ED Disposition    ED Disposition Condition Comment   Admit  Hospital Area: [place] [[id number]]  Level of Care: Telemetry [5]  Admit to tele based on following criteria: Monitor for Ischemic changes  Diagnosis: Seizure [facility]) [[id number]]  Admitting Physician: Rise Patience [phone]  Attending Physician: Rise Patience [email or web address]  PT Class (Do Not Modify): Observation [104]  PT Acc Code (Do Not Modify): Observation [10022]       Medical History Past Medical History:  Diagnosis Date  . Depression   . Gait disorder   . 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 Seizures ([place])   . Traumatic brain injury ([place])     Allergies No Known Allergies  IV Location/Drains/Wounds Patient Lines/Drains/Airways Status   Active Line/Drains/Airways    Name:   Placement date:   Placement time:   Site:   Days:   Peripheral IV 04/20/17 Right Antecubital   04/20/17    2055    Antecubital   1          Labs/Imaging Results for orders placed or performed during the hospital encounter of 04/20/17 (from the past 48 hour(s))  CBC with Differential     Status: Abnormal   Collection Time: 04/20/17  8:56 PM  Result Value Ref Range   WBC 5.1 4.0 - 10.5 K/uL   RBC 4.76 4.22 - 5.81 MIL/uL   Hemoglobin 14.4 13.0 - 17.0 g/dL   HCT 43.1 39.0 - 52.0 %   MCV 90.5 78.0 - 100.0 fL   MCH 30.3 26.0 - 34.0 pg   MCHC 33.4 30.0 - 36.0  g/dL   RDW 13.8 11.5 - 15.5 %   Platelets 217 150 - 400 K/uL   Neutrophils Relative % 73 %   Neutro Abs 3.7 1.7 - 7.7 K/uL   Lymphocytes Relative 12 %   Lymphs Abs 0.6 (L) 0.7 - 4.0 K/uL   Monocytes Relative 15 %   Monocytes Absolute 0.7 0.1 - 1.0 K/uL   Eosinophils Relative 0 %   Eosinophils Absolute 0.0 0.0 - 0.7 K/uL   Basophils Relative 0 %   Basophils Absolute 0.0 0.0 - 0.1 K/uL    Comment: Performed at [facility], [place] [address] [place], [place] [address]  Basic metabolic panel     Status: Abnormal   Collection Time: 04/20/17  8:56 PM  Result Value Ref Range   Sodium 136 135 - 145 mmol/L   Potassium 4.0 3.5 - 5.1 mmol/L   Chloride 98 (L) 101 - 111 mmol/L   CO2 27 22 - 32 mmol/L   Glucose, Bld 94 65 - 99 mg/dL   BUN 16 6 - 20 mg/dL   Creatinine, Ser 1.04 0.61 - 1.24 mg/dL   Calcium 9.3 8.9 - 10.3 mg/dL   GFR calc non Af Amer >60 >60 mL/min  GFR calc Af Amer >60 >60 mL/min    Comment: (NOTE) The eGFR has been calculated using the CKD EPI equation. This calculation has not been validated in all clinical situations. eGFR's persistently <60 mL/min signify possible Chronic Kidney Disease.    Anion gap 11 5 - 15    Comment: Performed at [facility], [place] [address], [place], [place] [address]  Ethanol     Status: None   Collection Time: 04/20/17  8:56 PM  Result Value Ref Range   Alcohol, Ethyl (B) <10 <10 mg/dL    Comment:        LOWEST DETECTABLE LIMIT FOR SERUM ALCOHOL IS 10 mg/dL FOR MEDICAL PURPOSES ONLY Performed at [place] [address], [place], [place] [address]   CBC WITH DIFFERENTIAL     Status: None   Collection Time: 04/21/17  5:35 AM  Result Value Ref Range   WBC 6.5 4.0 - 10.5 K/uL   RBC 4.68 4.22 - 5.81 MIL/uL   Hemoglobin 14.2 13.0 - 17.0 g/dL   HCT 42.3 39.0 - 52.0 %   MCV 90.4 78.0 - 100.0 fL   MCH 30.3 26.0 - 34.0 pg   MCHC 33.6 30.0 - 36.0 g/dL   RDW 14.1 11.5 - 15.5 %    Platelets 220 150 - 400 K/uL   Neutrophils Relative % 72 %   Neutro Abs 4.6 1.7 - 7.7 K/uL   Lymphocytes Relative 12 %   Lymphs Abs 0.8 0.7 - 4.0 K/uL   Monocytes Relative 16 %   Monocytes Absolute 1.0 0.1 - 1.0 K/uL   Eosinophils Relative 0 %   Eosinophils Absolute 0.0 0.0 - 0.7 K/uL   Basophils Relative 0 %   Basophils Absolute 0.0 0.0 - 0.1 K/uL    Comment: Performed at [facility], [place] [address], [place], [place] [address]  Phenobarbital level     Status: Abnormal   Collection Time: 04/21/17  5:35 AM  Result Value Ref Range   Phenobarbital 13.6 (L) 15.0 - 30.0 ug/mL    Comment: Performed at [place] [address], [place], [place] [address]  CK     Status: None   Collection Time: 04/21/17  6:27 AM  Result Value Ref Range   Total CK 118 49 - 397 U/L    Comment: Performed at [facility], [place] [address] [place], [place] [address]  Comprehensive metabolic panel     Status: Abnormal   Collection Time: 04/21/17  6:27 AM  Result Value Ref Range   Sodium 133 (L) 135 - 145 mmol/L   Potassium 4.0 3.5 - 5.1 mmol/L   Chloride 100 (L) 101 - 111 mmol/L   CO2 23 22 - 32 mmol/L   Glucose, Bld 100 (H) 65 - 99 mg/dL   BUN 16 6 - 20 mg/dL   Creatinine, Ser 0.89 0.61 - 1.24 mg/dL   Calcium 8.5 (L) 8.9 - 10.3 mg/dL   Total Protein 6.7 6.5 - 8.1 g/dL   Albumin 3.8 3.5 - 5.0 g/dL   AST 28 15 - 41 U/L   ALT 18 17 - 63 U/L   Alkaline Phosphatase 133 (H) 38 - 126 U/L   Total Bilirubin 0.9 0.3 - 1.2 mg/dL   GFR calc non Af Amer >60 >60 mL/min   GFR calc Af Amer >60 >60 mL/min    Comment: (NOTE) The eGFR has been calculated using the CKD EPI equation. This calculation has  not been validated in all clinical situations. eGFR's persistently <60 mL/min signify possible Chronic Kidney Disease.    Anion gap 10 5 - 15    Comment: Performed at [facility], [place] [address] [place], [place] [address]  HIV antibody     Status:  None   Collection Time: 04/21/17  6:27 AM  Result Value Ref Range   HIV Screen 4th Generation wRfx Non Reactive Non Reactive    Comment: (NOTE) Performed At: [facility] [place], [place] [id number] Rush Farmer MD [id number] Performed at [facility], [place] [address], [place], [place] [address]    Ct Head Wo Contrast  Result Date: 04/20/2017 CLINICAL DATA:  Found down.  Trauma EXAM: CT HEAD WITHOUT CONTRAST CT MAXILLOFACIAL WITHOUT CONTRAST CT CERVICAL SPINE WITHOUT CONTRAST TECHNIQUE: Multidetector CT imaging of the head, cervical spine, and maxillofacial structures were performed using the standard protocol without intravenous contrast. Multiplanar CT image reconstructions of the cervical spine and maxillofacial structures were also generated. COMPARISON:  CT head 06/25/2005 FINDINGS: CT HEAD FINDINGS Brain: Generalized atrophy and chronic microvascular ischemia in the white matter. Benign basal ganglia cysts bilaterally. Negative for acute infarct, hemorrhage, mass Vascular: Negative for hyperdense vessel Skull: Negative Other: Mild mucosal edema paranasal sinuses bilaterally. CT MAXILLOFACIAL FINDINGS Osseous: Negative for facial fracture Poor dentition with caries. Lucency around left upper third molar with extensive caries. Orbits: Normal orbit. No soft tissue swelling or mass in the orbit. Sinuses: Mild mucosal edema in the maxillary sinus bilaterally. No air-fluid level. Soft tissues: Soft tissue swelling superior to the right orbit compatible with contusion. CT CERVICAL SPINE FINDINGS Alignment: Normal Skull base and vertebrae: Negative for fracture Soft tissues and spinal canal: Negative Disc levels: Disc degeneration and spurring in the cervical spine most prominent C5-6 and C6-7. This causes mild spinal and foraminal stenosis bilaterally. Upper chest: Negative Other: None IMPRESSION: 1. Atrophy and chronic microvascular ischemia.  No acute  abnormality 2. Negative for facial fracture. Soft tissue swelling superior to the right orbit compatible with contusion 3. Negative for cervical spine fracture. Cervical spondylosis C5-6 and C6-7 Electronically Signed   By: Franchot Gallo M.D.   On: 04/20/2017 20:47   Ct Cervical Spine Wo Contrast  Result Date: 04/20/2017 CLINICAL DATA:  Found down.  Trauma EXAM: CT HEAD WITHOUT CONTRAST CT MAXILLOFACIAL WITHOUT CONTRAST CT CERVICAL SPINE WITHOUT CONTRAST TECHNIQUE: Multidetector CT imaging of the head, cervical spine, and maxillofacial structures were performed using the standard protocol without intravenous contrast. Multiplanar CT image reconstructions of the cervical spine and maxillofacial structures were also generated. COMPARISON:  CT head 06/25/2005 FINDINGS: CT HEAD FINDINGS Brain: Generalized atrophy and chronic microvascular ischemia in the white matter. Benign basal ganglia cysts bilaterally. Negative for acute infarct, hemorrhage, mass Vascular: Negative for hyperdense vessel Skull: Negative Other: Mild mucosal edema paranasal sinuses bilaterally. CT MAXILLOFACIAL FINDINGS Osseous: Negative for facial fracture Poor dentition with caries. Lucency around left upper third molar with extensive caries. Orbits: Normal orbit. No soft tissue swelling or mass in the orbit. Sinuses: Mild mucosal edema in the maxillary sinus bilaterally. No air-fluid level. Soft tissues: Soft tissue swelling superior to the right orbit compatible with contusion. CT CERVICAL SPINE FINDINGS Alignment: Normal Skull base and vertebrae: Negative for fracture Soft tissues and spinal canal: Negative Disc levels: Disc degeneration and spurring in the cervical spine most prominent C5-6 and C6-7. This causes mild spinal and foraminal stenosis bilaterally. Upper chest: Negative Other: None IMPRESSION: 1. Atrophy and  chronic microvascular ischemia.  No acute abnormality 2. Negative for facial fracture. Soft tissue swelling superior to the  right orbit compatible with contusion 3. Negative for cervical spine fracture. Cervical spondylosis C5-6 and C6-7 Electronically Signed   By: Franchot Gallo M.D.   On: 04/20/2017 20:47   Mr Brain Wo Contrast  Result Date: 04/21/2017 CLINICAL DATA:  Found unconscious.  Possible seizure. EXAM: MRI HEAD WITHOUT CONTRAST TECHNIQUE: Multiplanar, multiecho pulse sequences of the brain and surrounding structures were obtained without intravenous contrast. COMPARISON:  CT yesterday.  MRI 04/22/2004. FINDINGS: Brain: Diffusion imaging does not show any acute or subacute infarction. No focal brainstem or cerebellar finding. Cerebral hemispheres show atrophy with moderate chronic small-vessel ischemic changes of the white matter. No cortical or large vessel territory infarction. No mass lesion, hemorrhage, hydrocephalus or extra-axial collection. Dilated perivascular spaces at the base of the brain, right more than left. Vascular: Major vessels at the base of the brain show flow. Skull and upper cervical spine: Negative Sinuses/Orbits: Mild seasonal mucosal inflammatory changes. Orbits negative. Other: None IMPRESSION: No acute or reversible finding. Atrophy and moderate chronic small-vessel ischemic changes of the hemispheric white matter. Electronically Signed   By: Nelson Chimes M.D.   On: 04/21/2017 10:07   Ct Maxillofacial Wo Contrast  Result Date: 04/20/2017 CLINICAL DATA:  Found down.  Trauma EXAM: CT HEAD WITHOUT CONTRAST CT MAXILLOFACIAL WITHOUT CONTRAST CT CERVICAL SPINE WITHOUT CONTRAST TECHNIQUE: Multidetector CT imaging of the head, cervical spine, and maxillofacial structures were performed using the standard protocol without intravenous contrast. Multiplanar CT image reconstructions of the cervical spine and maxillofacial structures were also generated. COMPARISON:  CT head 06/25/2005 FINDINGS: CT HEAD FINDINGS Brain: Generalized atrophy and chronic microvascular ischemia in the white matter. Benign basal  ganglia cysts bilaterally. Negative for acute infarct, hemorrhage, mass Vascular: Negative for hyperdense vessel Skull: Negative Other: Mild mucosal edema paranasal sinuses bilaterally. CT MAXILLOFACIAL FINDINGS Osseous: Negative for facial fracture Poor dentition with caries. Lucency around left upper third molar with extensive caries. Orbits: Normal orbit. No soft tissue swelling or mass in the orbit. Sinuses: Mild mucosal edema in the maxillary sinus bilaterally. No air-fluid level. Soft tissues: Soft tissue swelling superior to the right orbit compatible with contusion. CT CERVICAL SPINE FINDINGS Alignment: Normal Skull base and vertebrae: Negative for fracture Soft tissues and spinal canal: Negative Disc levels: Disc degeneration and spurring in the cervical spine most prominent C5-6 and C6-7. This causes mild spinal and foraminal stenosis bilaterally. Upper chest: Negative Other: None IMPRESSION: 1. Atrophy and chronic microvascular ischemia.  No acute abnormality 2. Negative for facial fracture. Soft tissue swelling superior to the right orbit compatible with contusion 3. Negative for cervical spine fracture. Cervical spondylosis C5-6 and C6-7 Electronically Signed   By: Franchot Gallo M.D.   On: 04/20/2017 20:47    Pending Labs Unresulted Labs (From admission, onward)   None      Vitals/Pain Today's Vitals   04/21/17 0400 04/21/17 0600 04/21/17 1124 04/21/17 1441  BP: (!) 147/77 (!) 155/75 118/63 132/66  Pulse: 76 77 84 71  Resp: 19 18 18 20   Temp:      TempSrc:      SpO2: 92% 92% 100% 100%  PainSc:        Isolation Precautions No active isolations  Medications Medications  bacitracin ointment (1 application Topical Given 04/21/17 1141)  levETIRAcetam (KEPPRA) tablet 750 mg (750 mg Oral Given 04/21/17 1141)  lamoTRIgine (LAMICTAL) tablet 200 mg (200 mg Oral Given  04/21/17 1141)  PHENobarbital (LUMINAL) tablet 97.2 mg (97.2 mg Oral Given 04/21/17 0529)  acetaminophen (TYLENOL) tablet  650 mg (not administered)    Or  acetaminophen (TYLENOL) suppository 650 mg (not administered)  ondansetron (ZOFRAN) tablet 4 mg (not administered)    Or  ondansetron (ZOFRAN) injection 4 mg (not administered)  0.9 %  sodium chloride infusion ( Intravenous New Bag/Given 04/21/17 0531)  hydrALAZINE (APRESOLINE) injection 10 mg (not administered)  Tdap (BOOSTRIX) injection 0.5 mL (0.5 mLs Intramuscular Given 04/20/17 2057)  LORazepam (ATIVAN) injection 1 mg (1 mg Intravenous Given 04/20/17 2101)  lidocaine-EPINEPHrine (XYLOCAINE W/EPI) 2 %-1:200000 (PF) injection 10 mL (10 mLs Intradermal Given by Other 04/20/17 1907)  cephALEXin (KEFLEX) capsule 500 mg (500 mg Oral Given 04/20/17 2103)  levETIRAcetam (KEPPRA) IVPB 1000 mg/100 mL premix (0 mg Intravenous Stopped 04/20/17 2233)    Mobility walks

## 2017-04-21 NOTE — H&P (Signed)
History and Physical    Craig Fox ZOX:[id number] DOB: [birth year] DOA: 04/20/2017  PCP: Patient, No Pcp Per  Patient coming from: Home.  Chief Complaint: Fall on the floor.  HPI: Craig Fox is a 62 y.o. male with history of seizures was found on the floor and was brought to the ER.  Patient does not recall what happened but states that he remembers being brought by EMS.  He states he has been compliant with his medications.  And he had come to the ER a few days ago after he had a seizure episode.  ED Course: In the ER patient had prolonged state where he was appeared to be confused.  CT of the head and C-spine and maxillofacial only showed contusion in the frontal area.  Otherwise unremarkable.  Patient had laceration on his right supra orbital area.  Tele neurologist was consulted and at this time neurologist recommended loading 1 g of IV Keppra and admitting for observation continue his present medications.  At the time of my exam patient is more alert awake.  Moves all extremities.  He had 1 dose of tetanus toxoid in Keflex for his laceration on his forehead.  Review of Systems: As per HPI, rest all negative.   Past Medical History:  Diagnosis Date  . Depression   . Gait disorder   . 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 Seizures (HCC)   . Traumatic brain injury [facility])     Past Surgical History:  Procedure Laterality Date  . traumatic amputation finger Left    Ring finger     reports that  has never smoked. he has never used smokeless tobacco. He reports that he does not drink alcohol or use drugs.  No Known Allergies  Family History  Problem Relation Age of Onset  . Mental illness Mother   . Heart attack Father     Prior to Admission medications   Medication Sig Start Date End Date Taking? Authorizing Provider  KEPPRA 750 MG tablet TAKE 1 TABLET (750 MG TOTAL) BY MOUTH 2 (TWO) TIMES DAILY. 04/02/17  Yes York Spaniel,  MD  LAMICTAL 200 MG tablet ONE TABLET IN THE MORNING AND TWO TABLETS IN THE EVENING 04/15/17  Yes York Spaniel, MD  PHENobarbital (LUMINAL) 97.2 MG tablet TAKE 1 TABLET BY MOUTH AT BEDTIME 04/07/17  Yes York Spaniel, MD    Physical Exam: Vitals:   04/20/17 1850 04/21/17 0055 04/21/17 0315 04/21/17 0400  BP: (!) 165/72 (!) 157/72 (!) 163/82 (!) 147/77  Pulse: 90 88 92 76  Resp: 18 20 16 19   Temp: 99.7 F (37.6 C)     TempSrc: Oral     SpO2: 97% 98% 91% 92%      Constitutional: Moderately built and nourished. Vitals:   04/20/17 1850 04/21/17 0055 04/21/17 0315 04/21/17 0400  BP: (!) 165/72 (!) 157/72 (!) 163/82 (!) 147/77  Pulse: 90 88 92 76  Resp: 18 20 16 19   Temp: 99.7 F (37.6 C)     TempSrc: Oral     SpO2: 97% 98% 91% 92%   Eyes: Anicteric no pallor. ENMT: Laceration on the forehead. Neck: No neck rigidity no mass palpated no JVD appreciated. Respiratory: No rhonchi or crepitations. Cardiovascular: S1-S2 heard. Abdomen: Soft nontender bowel sounds present. Musculoskeletal: No edema.  No joint effusion. Skin: Forehead laceration. Neurologic: Alert awake oriented to time place and person.  Moves all extremities. Psychiatric: Appears normal.  Normal affect.  Labs on Admission: I have personally reviewed following labs and imaging studies  CBC: Recent Labs  Lab 04/20/17 2056  WBC 5.1  NEUTROABS 3.7  HGB 14.4  HCT 43.1  MCV 90.5  PLT 217   Basic Metabolic Panel: Recent Labs  Lab 04/20/17 2056  NA 136  K 4.0  CL 98*  CO2 27  GLUCOSE 94  BUN 16  CREATININE 1.04  CALCIUM 9.3   GFR: CrCl cannot be calculated (Unknown ideal weight.). Liver Function Tests: No results for input(s): AST, ALT, ALKPHOS, BILITOT, PROT, ALBUMIN in the last 168 hours. No results for input(s): LIPASE, AMYLASE in the last 168 hours. No results for input(s): AMMONIA in the last 168 hours. Coagulation Profile: No results for input(s): INR, PROTIME in the last 168  hours. Cardiac Enzymes: No results for input(s): CKTOTAL, CKMB, CKMBINDEX, TROPONINI in the last 168 hours. BNP (last 3 results) No results for input(s): PROBNP in the last 8760 hours. HbA1C: No results for input(s): HGBA1C in the last 72 hours. CBG: No results for input(s): GLUCAP in the last 168 hours. Lipid Profile: No results for input(s): CHOL, HDL, LDLCALC, TRIG, CHOLHDL, LDLDIRECT in the last 72 hours. Thyroid Function Tests: No results for input(s): TSH, T4TOTAL, FREET4, T3FREE, THYROIDAB in the last 72 hours. Anemia Panel: No results for input(s): VITAMINB12, FOLATE, FERRITIN, TIBC, IRON, RETICCTPCT in the last 72 hours. Urine analysis: No results found for: COLORURINE, APPEARANCEUR, LABSPEC, PHURINE, GLUCOSEU, HGBUR, BILIRUBINUR, KETONESUR, PROTEINUR, UROBILINOGEN, NITRITE, LEUKOCYTESUR Sepsis Labs: @LABRCNTIP (procalcitonin:4,lacticidven:4) )No results found for this or any previous visit (from the past 240 hour(s)).   Radiological Exams on Admission: Ct Head Wo Contrast  Result Date: 04/20/2017 CLINICAL DATA:  Found down.  Trauma EXAM: CT HEAD WITHOUT CONTRAST CT MAXILLOFACIAL WITHOUT CONTRAST CT CERVICAL SPINE WITHOUT CONTRAST TECHNIQUE: Multidetector CT imaging of the head, cervical spine, and maxillofacial structures were performed using the standard protocol without intravenous contrast. Multiplanar CT image reconstructions of the cervical spine and maxillofacial structures were also generated. COMPARISON:  CT head 06/25/2005 FINDINGS: CT HEAD FINDINGS Brain: Generalized atrophy and chronic microvascular ischemia in the white matter. Benign basal ganglia cysts bilaterally. Negative for acute infarct, hemorrhage, mass Vascular: Negative for hyperdense vessel Skull: Negative Other: Mild mucosal edema paranasal sinuses bilaterally. CT MAXILLOFACIAL FINDINGS Osseous: Negative for facial fracture Poor dentition with caries. Lucency around left upper third molar with extensive  caries. Orbits: Normal orbit. No soft tissue swelling or mass in the orbit. Sinuses: Mild mucosal edema in the maxillary sinus bilaterally. No air-fluid level. Soft tissues: Soft tissue swelling superior to the right orbit compatible with contusion. CT CERVICAL SPINE FINDINGS Alignment: Normal Skull base and vertebrae: Negative for fracture Soft tissues and spinal canal: Negative Disc levels: Disc degeneration and spurring in the cervical spine most prominent C5-6 and C6-7. This causes mild spinal and foraminal stenosis bilaterally. Upper chest: Negative Other: None IMPRESSION: 1. Atrophy and chronic microvascular ischemia.  No acute abnormality 2. Negative for facial fracture. Soft tissue swelling superior to the right orbit compatible with contusion 3. Negative for cervical spine fracture. Cervical spondylosis C5-6 and C6-7 Electronically Signed   By: [name]   On: 04/20/2017 20:47   Ct Cervical Spine Wo Contrast  Result Date: 04/20/2017 CLINICAL DATA:  Found down.  Trauma EXAM: CT HEAD WITHOUT CONTRAST CT MAXILLOFACIAL WITHOUT CONTRAST CT CERVICAL SPINE WITHOUT CONTRAST TECHNIQUE: Multidetector CT imaging of the head, cervical spine, and maxillofacial structures were performed using the standard protocol without intravenous contrast. Multiplanar  CT image reconstructions of the cervical spine and maxillofacial structures were also generated. COMPARISON:  CT head 06/25/2005 FINDINGS: CT HEAD FINDINGS Brain: Generalized atrophy and chronic microvascular ischemia in the white matter. Benign basal ganglia cysts bilaterally. Negative for acute infarct, hemorrhage, mass Vascular: Negative for hyperdense vessel Skull: Negative Other: Mild mucosal edema paranasal sinuses bilaterally. CT MAXILLOFACIAL FINDINGS Osseous: Negative for facial fracture Poor dentition with caries. Lucency around left upper third molar with extensive caries. Orbits: Normal orbit. No soft tissue swelling or mass in the orbit.  Sinuses: Mild mucosal edema in the maxillary sinus bilaterally. No air-fluid level. Soft tissues: Soft tissue swelling superior to the right orbit compatible with contusion. CT CERVICAL SPINE FINDINGS Alignment: Normal Skull base and vertebrae: Negative for fracture Soft tissues and spinal canal: Negative Disc levels: Disc degeneration and spurring in the cervical spine most prominent C5-6 and C6-7. This causes mild spinal and foraminal stenosis bilaterally. Upper chest: Negative Other: None IMPRESSION: 1. Atrophy and chronic microvascular ischemia.  No acute abnormality 2. Negative for facial fracture. Soft tissue swelling superior to the right orbit compatible with contusion 3. Negative for cervical spine fracture. Cervical spondylosis C5-6 and C6-7 Electronically Signed   By: Marlan Palau M.D.   On: 04/20/2017 20:47   Ct Maxillofacial Wo Contrast  Result Date: 04/20/2017 CLINICAL DATA:  Found down.  Trauma EXAM: CT HEAD WITHOUT CONTRAST CT MAXILLOFACIAL WITHOUT CONTRAST CT CERVICAL SPINE WITHOUT CONTRAST TECHNIQUE: Multidetector CT imaging of the head, cervical spine, and maxillofacial structures were performed using the standard protocol without intravenous contrast. Multiplanar CT image reconstructions of the cervical spine and maxillofacial structures were also generated. COMPARISON:  CT head 06/25/2005 FINDINGS: CT HEAD FINDINGS Brain: Generalized atrophy and chronic microvascular ischemia in the white matter. Benign basal ganglia cysts bilaterally. Negative for acute infarct, hemorrhage, mass Vascular: Negative for hyperdense vessel Skull: Negative Other: Mild mucosal edema paranasal sinuses bilaterally. CT MAXILLOFACIAL FINDINGS Osseous: Negative for facial fracture Poor dentition with caries. Lucency around left upper third molar with extensive caries. Orbits: Normal orbit. No soft tissue swelling or mass in the orbit. Sinuses: Mild mucosal edema in the maxillary sinus bilaterally. No air-fluid  level. Soft tissues: Soft tissue swelling superior to the right orbit compatible with contusion. CT CERVICAL SPINE FINDINGS Alignment: Normal Skull base and vertebrae: Negative for fracture Soft tissues and spinal canal: Negative Disc levels: Disc degeneration and spurring in the cervical spine most prominent C5-6 and C6-7. This causes mild spinal and foraminal stenosis bilaterally. Upper chest: Negative Other: None IMPRESSION: 1. Atrophy and chronic microvascular ischemia.  No acute abnormality 2. Negative for facial fracture. Soft tissue swelling superior to the right orbit compatible with contusion 3. Negative for cervical spine fracture. Cervical spondylosis C5-6 and C6-7 Electronically Signed   By: Marlan Palau M.D.   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 Breakthrough seizures in a patient with known history of generalized tonic-clonic seizure and epilepsy -appreciate neurology consult.  Patient was given 1 g IV Keppra.  Will continue with patient's home dose of Keppra phenobarbital and Lamictal.  Check phenobarbital level.  MRI brain pending.  Urine drug screen pending.  Alcohol level was negative. 2. Elevated blood pressure -closely follow blood pressure trends. 3. Laceration to the forehead.  Was given Keflex and tetanus toxoid.   DVT prophylaxis: SCDs. Code Status: Full code. Family Communication: Discussed  with patient. Disposition Plan: Home. Consults called: Neurologist. Admission status: Observation.   Eduard Clos MD [facility] Pager [phone].  If 7PM-7AM, please contact night-coverage [email or web address] Password [facility]  04/21/2017, 4:22 AM

## 2017-04-22 MED ORDER — BACITRACIN ZINC 500 UNIT/GM EX OINT: TOPICAL_OINTMENT | Freq: Two times a day (BID) | CUTANEOUS | 0 refills | Status: DC

## 2017-04-22 NOTE — Progress Notes (Signed)
CM met with pt at bedside. This CM confirmed that pt has insurance and medication coverage through [employer]. Pt given [employer] coupon in case needed. Pt declines HHPT at this time. RW was requested and [facility] rep alerted of order for rolling walker. Marney Doctor RN,BSN,NCM [phone]

## 2017-04-22 NOTE — Discharge Summary (Signed)
Physician Discharge Summary  Craig Fox ZOX:[id number] DOB: 03-08-1955 DOA: 04/20/2017  PCP: Craig Fox  Admit date: 04/20/2017 Discharge date: 04/22/2017  Admitted From: Home Disposition: Home  Recommendations for Outpatient Follow-up:  1. Follow up with PCP AND NEUROLOGIST Craig Fox  in 1-2 weeks 2. Please obtain BMP/CBC in one week  Home Health: None Equipment/Devices none  Discharge Condition stable CODE STATUS full code Diet recommendation regular diet Brief/Interim Summary: 62 y.o. male with history of seizures was found on the floor and was brought to the ER.  Patient does not recall what happened but states that he remembers being brought by EMS.  He states he has been compliant with his medications.  And he had come to the ER a few days ago after he had a seizure episode.  ED Course: In the ER patient had prolonged state where he was appeared to be confused.  CT of the head and C-spine and maxillofacial only showed contusion in the frontal area.  Otherwise unremarkable.  Patient had laceration on his right supra orbital area.  Tele neurologist was consulted and at this time neurologist recommended loading 1 g of IV Keppra and admitting for observation continue his present medications.  At the time of my exam patient is more alert awake.  Moves all extremities.  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  Patient reports having difficulty getting his home antiseizure medications.  I discussed the case with Craig Fox .he agrees with telemetry  neurologist assessment and plan.phenobarbital level was 13.6 which is slightly low.  But will not increase his dose patient's level is low because of noncompliance to medications.  Case manager consult has been placed. MRI brainNo acute or reversible finding. Atrophy and moderate chronic 1. small-vessel ischemic changes of the hemispheric white matter .  Alcohol level was negative. 2. Elevated blood pressure -his blood pressure was normal upon discharge.. 3. Laceration to the forehead.  Was given Keflex and tetanus toxoid.      Discharge Instructions  Discharge Instructions    Call Fox for:  difficulty breathing, headache or visual disturbances   Complete by:  As directed    Call Fox for:  persistant dizziness or light-headedness   Complete by:  As directed    Call Fox for:  persistant nausea and vomiting   Complete by:  As directed    Call Fox for:  severe uncontrolled pain   Complete by:  As directed    Diet - low sodium heart healthy   Complete by:  As directed    Increase activity slowly   Complete by:  As directed      Allergies as of 04/22/2017   No Known Allergies     Medication List    TAKE these medications   bacitracin ointment Apply topically 2 (two) times daily.   KEPPRA 750 MG tablet Generic drug:  levETIRAcetam TAKE 1 TABLET (750 MG TOTAL) BY MOUTH 2 (TWO) TIMES DAILY.   LAMICTAL 200 MG tablet Generic drug:  lamoTRIgine ONE TABLET IN THE MORNING AND TWO TABLETS IN THE EVENING  PHENobarbital 97.2 MG tablet Commonly known as:  LUMINAL TAKE 1 TABLET BY MOUTH AT BEDTIME      Follow-up Information    Craig Fox Follow up.   Specialty:  Neurology Contact information: [address] [place] [address] [phone]          No Known Allergies  Consultations:   Procedures/Studies: Ct Head Wo Contrast  Result Date: 04/20/2017 CLINICAL DATA:  Found down.  Trauma EXAM: CT HEAD WITHOUT CONTRAST CT MAXILLOFACIAL WITHOUT  CONTRAST CT CERVICAL SPINE WITHOUT CONTRAST TECHNIQUE: Multidetector CT imaging of the head, cervical spine, and maxillofacial structures were performed using the standard protocol without intravenous contrast. Multiplanar CT image reconstructions of the cervical spine and maxillofacial structures were also generated. COMPARISON:  CT head 06/25/2005 FINDINGS: CT HEAD FINDINGS Brain: Generalized atrophy and chronic microvascular ischemia in the white matter. Benign basal ganglia cysts bilaterally. Negative for acute infarct, hemorrhage, mass Vascular: Negative for hyperdense vessel Skull: Negative Other: Mild mucosal edema paranasal sinuses bilaterally. CT MAXILLOFACIAL FINDINGS Osseous: Negative for facial fracture Poor dentition with caries. Lucency around left upper third molar with extensive caries. Orbits: Normal orbit. No soft tissue swelling or mass in the orbit. Sinuses: Mild mucosal edema in the maxillary sinus bilaterally. No air-fluid level. Soft tissues: Soft tissue swelling superior to the right orbit compatible with contusion. CT CERVICAL SPINE FINDINGS Alignment: Normal Skull base and vertebrae: Negative for fracture Soft tissues and spinal canal: Negative Disc levels: Disc degeneration and spurring in the cervical spine most prominent C5-6 and C6-7. This causes mild spinal and foraminal stenosis bilaterally. Upper chest: Negative Other: None IMPRESSION: 1. Atrophy and chronic microvascular ischemia.  No acute abnormality 2. Negative for facial fracture. Soft tissue swelling superior to the right orbit compatible with contusion 3. Negative for cervical spine fracture. Cervical spondylosis C5-6 and C6-7 Electronically Signed   By: Craig Palauharles  Clark M.D.   On: 04/20/2017 20:47   Ct Cervical Spine Wo Contrast  Result Date: 04/20/2017 CLINICAL DATA:  Found down.  Trauma EXAM: CT HEAD WITHOUT CONTRAST CT MAXILLOFACIAL WITHOUT CONTRAST CT CERVICAL SPINE WITHOUT CONTRAST TECHNIQUE: Multidetector CT imaging  of the head, cervical spine, and maxillofacial structures were performed using the standard protocol without intravenous contrast. Multiplanar CT image reconstructions of the cervical spine and maxillofacial structures were also generated. COMPARISON:  CT head 06/25/2005 FINDINGS: CT HEAD FINDINGS Brain: Generalized atrophy and chronic microvascular ischemia in the white matter. Benign basal ganglia cysts bilaterally. Negative for acute infarct, hemorrhage, mass Vascular: Negative for hyperdense vessel Skull: Negative Other: Mild mucosal edema paranasal sinuses bilaterally. CT MAXILLOFACIAL FINDINGS Osseous: Negative for facial fracture Poor dentition with caries. Lucency around left upper third molar with extensive caries. Orbits: Normal orbit. No soft tissue swelling or mass in the orbit. Sinuses: Mild mucosal edema in the maxillary sinus bilaterally. No air-fluid level. Soft tissues: Soft tissue swelling superior to the right orbit compatible with contusion. CT CERVICAL SPINE FINDINGS Alignment: Normal Skull base and vertebrae: Negative for fracture Soft tissues and spinal canal: Negative Disc levels: Disc degeneration and spurring in the cervical spine most prominent C5-6 and C6-7. This causes mild spinal and foraminal stenosis bilaterally. Upper chest: Negative Other: None IMPRESSION: 1. Atrophy and chronic microvascular ischemia.  No acute abnormality 2. Negative for facial fracture. Soft tissue swelling superior to the right orbit compatible with contusion 3. Negative for cervical spine fracture. Cervical spondylosis C5-6 and C6-7 Electronically Signed   By: Craig Palauharles  Clark M.D.   On:  04/20/2017 20:47   Mr Brain Wo Contrast  Result Date: 04/21/2017 CLINICAL DATA:  Found unconscious.  Possible seizure. EXAM: MRI HEAD WITHOUT CONTRAST TECHNIQUE: Multiplanar, multiecho pulse sequences of the brain and surrounding structures were obtained without intravenous contrast. COMPARISON:  CT yesterday.  MRI  04/22/2004. FINDINGS: Brain: Diffusion imaging does not show any acute or subacute infarction. No focal brainstem or cerebellar finding. Cerebral hemispheres show atrophy with moderate chronic small-vessel ischemic changes of the white matter. No cortical or large vessel territory infarction. No mass lesion, hemorrhage, hydrocephalus or extra-axial collection. Dilated perivascular spaces at the base of the brain, right more than left. Vascular: Major vessels at the base of the brain show flow. Skull and upper cervical spine: Negative Sinuses/Orbits: Mild seasonal mucosal inflammatory changes. Orbits negative. Other: None IMPRESSION: No acute or reversible finding. Atrophy and moderate chronic small-vessel ischemic changes of the hemispheric white matter. Electronically Signed   By: Paulina Fusi M.D.   On: 04/21/2017 10:07   Ct Maxillofacial Wo Contrast  Result Date: 04/20/2017 CLINICAL DATA:  Found down.  Trauma EXAM: CT HEAD WITHOUT CONTRAST CT MAXILLOFACIAL WITHOUT CONTRAST CT CERVICAL SPINE WITHOUT CONTRAST TECHNIQUE: Multidetector CT imaging of the head, cervical spine, and maxillofacial structures were performed using the standard protocol without intravenous contrast. Multiplanar CT image reconstructions of the cervical spine and maxillofacial structures were also generated. COMPARISON:  CT head 06/25/2005 FINDINGS: CT HEAD FINDINGS Brain: Generalized atrophy and chronic microvascular ischemia in the white matter. Benign basal ganglia cysts bilaterally. Negative for acute infarct, hemorrhage, mass Vascular: Negative for hyperdense vessel Skull: Negative Other: Mild mucosal edema paranasal sinuses bilaterally. CT MAXILLOFACIAL FINDINGS Osseous: Negative for facial fracture Poor dentition with caries. Lucency around left upper third molar with extensive caries. Orbits: Normal orbit. No soft tissue swelling or mass in the orbit. Sinuses: Mild mucosal edema in the maxillary sinus bilaterally. No air-fluid  level. Soft tissues: Soft tissue swelling superior to the right orbit compatible with contusion. CT CERVICAL SPINE FINDINGS Alignment: Normal Skull base and vertebrae: Negative for fracture Soft tissues and spinal canal: Negative Disc levels: Disc degeneration and spurring in the cervical spine most prominent C5-6 and C6-7. This causes mild spinal and foraminal stenosis bilaterally. Upper chest: Negative Other: None IMPRESSION: 1. Atrophy and chronic microvascular ischemia.  No acute abnormality 2. Negative for facial fracture. Soft tissue swelling superior to the right orbit compatible with contusion 3. Negative for cervical spine fracture. Cervical spondylosis C5-6 and C6-7 Electronically Signed   By: Craig Palau M.D.   On: 04/20/2017 20:47    (Echo, Carotid, EGD, Colonoscopy, ERCP)    Subjective:   Discharge Exam: Vitals:   04/21/17 2200 04/22/17 0500  BP: (!) 118/51 (!) 115/39  Pulse: 72 72  Resp: 14 14  Temp: 98.6 F (37 C) 99 F (37.2 C)  SpO2: 96% 96%   Vitals:   04/21/17 1749 04/21/17 1822 04/21/17 2200 04/22/17 0500  BP: 138/61  (!) 118/51 (!) 115/39  Pulse: 79  72 72  Resp: 18  14 14   Temp: 99 F (37.2 C)  98.6 F (37 C) 99 F (37.2 C)  TempSrc: Oral  Oral Oral  SpO2: 98%  96% 96%  Weight:  76.8 kg (169 lb 5 oz)    Height:  6' (1.829 m)      General: Pt is alert, awake, not in acute distress Cardiovascular: RRR, S1/S2 +, no rubs, no gallops Respiratory: CTA bilaterally, no wheezing, no rhonchi Abdominal: Soft, NT, ND, bowel sounds +  Extremities: no edema, no cyanosis    The results of significant diagnostics from this hospitalization (including imaging, microbiology, ancillary and laboratory) are listed below for reference.     Microbiology: No results found for this or any previous visit (from the past 240 hour(s)).   Labs: BNP (last 3 results) No results for input(s): BNP in the last 8760 hours. Basic Metabolic Panel: Recent Labs  Lab  04/20/17 2056 04/21/17 0627  NA 136 133*  Fox 4.0 4.0  CL 98* 100*  CO2 27 23  GLUCOSE 94 100*  BUN 16 16  CREATININE 1.04 0.89  CALCIUM 9.3 8.5*   Liver Function Tests: Recent Labs  Lab 04/21/17 0627  AST 28  ALT 18  ALKPHOS 133*  BILITOT 0.9  PROT 6.7  ALBUMIN 3.8   No results for input(s): LIPASE, AMYLASE in the last 168 hours. No results for input(s): AMMONIA in the last 168 hours. CBC: Recent Labs  Lab 04/20/17 2056 04/21/17 0535  WBC 5.1 6.5  NEUTROABS 3.7 4.6  HGB 14.4 14.2  HCT 43.1 42.3  MCV 90.5 90.4  PLT 217 220   Cardiac Enzymes: Recent Labs  Lab 04/21/17 0627  CKTOTAL 118   BNP: Invalid input(s): POCBNP CBG: No results for input(s): GLUCAP in the last 168 hours. D-Dimer No results for input(s): DDIMER in the last 72 hours. Hgb A1c No results for input(s): HGBA1C in the last 72 hours. Lipid Profile No results for input(s): CHOL, HDL, LDLCALC, TRIG, CHOLHDL, LDLDIRECT in the last 72 hours. Thyroid function studies No results for input(s): TSH, T4TOTAL, T3FREE, THYROIDAB in the last 72 hours.  Invalid input(s): FREET3 Anemia work up No results for input(s): VITAMINB12, FOLATE, FERRITIN, TIBC, IRON, RETICCTPCT in the last 72 hours. Urinalysis No results found for: COLORURINE, APPEARANCEUR, LABSPEC, PHURINE, GLUCOSEU, HGBUR, BILIRUBINUR, KETONESUR, PROTEINUR, UROBILINOGEN, NITRITE, LEUKOCYTESUR Sepsis Labs Invalid input(s): PROCALCITONIN,  WBC,  LACTICIDVEN Microbiology No results found for this or any previous visit (from the past 240 hour(s)).   Time coordinating discharge: Over 30 minutes  SIGNED:   Alwyn Ren, Fox  [facility] 04/22/2017, 8:47 AM  If 7PM-7AM, please contact night-coverage [email or web address] Password [email or web address]

## 2017-04-22 NOTE — Progress Notes (Signed)
LCSW gave patient taxi voucher. Per RN patient cannot ambulate bus steps. Patient does not have any other resources to get home.    Craig GandyBernette Craig Fox, LSCW [facility] CSW [phone]

## 2017-04-22 NOTE — Evaluation (Signed)
Physical Therapy Evaluation Patient Details Name: Craig Fox MRN: [id number] DOB: [birth year] Today's Date: 04/22/2017   History of Present Illness  62 yo male admitted with seizure, fall. Hx of Sz, TBI, gait d/o.   Clinical Impression  On eval, pt required Min guard-Min assist for mobility. He walked ~100 feet x 2 (once with Rw, once without device). Less unsteadiness with use of RW. Pt presents with general weakness, decreased activity tolerance, and impaired gait and balance. Discussed d/c plan-pt plans to return home. He is not agreeable to SNF. He is hesitant to agree to HHPT. Recommend RW use for safe ambulation and HHPT f/u.     Follow Up Recommendations Home health PT;Supervision - Intermittent    Equipment Recommendations  Rolling walker with 5" wheels    Recommendations for Other Services       Precautions / Restrictions Precautions Precautions: Fall Restrictions Weight Bearing Restrictions: No      Mobility  Bed Mobility               General bed mobility comments: oob in recliner  Transfers Overall transfer level: Needs assistance Equipment used: Rolling walker (2 wheeled);None Transfers: Sit to/from Stand           General transfer comment: x2. Close guard for safety.   Ambulation/Gait Ambulation/Gait assistance: Min guard;Min assist Ambulation Distance (Feet): 100 Feet(x2) Assistive device: Rolling walker (2 wheeled);None Gait Pattern/deviations: Decreased stride length;Narrow base of support;Step-through pattern     General Gait Details: x 2. Once with RW, once without device. Min assist when walking without device. Min guard assist with use of RW.   Stairs            Wheelchair Mobility    Modified Rankin (Stroke Patients Only)       Balance Overall balance assessment: Needs assistance;History of Falls           Standing balance-Leahy Scale: Fair Standing balance comment: Had pt perform EO/EC static standing, EO with  external perturbations, 360 degree turn, pick up object, narrow BOS                             Pertinent Vitals/Pain Pain Assessment: No/denies pain    Home Living Family/patient expects to be discharged to:: Private residence Living Arrangements: Alone   Type of Home: Apartment Home Access: Stairs to enter     Home Layout: One level Home Equipment: None      Prior Function Level of Independence: Independent               Hand Dominance        Extremity/Trunk Assessment   Upper Extremity Assessment Upper Extremity Assessment: Generalized weakness    Lower Extremity Assessment Lower Extremity Assessment: Generalized weakness    Cervical / Trunk Assessment Cervical / Trunk Assessment: Normal  Communication   Communication: No difficulties  Cognition Arousal/Alertness: Awake/alert Behavior During Therapy: WFL for tasks assessed/performed Overall Cognitive Status: Within Functional Limits for tasks assessed                                        General Comments      Exercises     Assessment/Plan    PT Assessment Patient needs continued PT services  PT Problem List Decreased mobility;Decreased balance;Decreased strength;Decreased activity tolerance       PT Treatment  Interventions DME instruction;Functional mobility training;Gait training;Therapeutic activities;Balance training;Patient/family education;Therapeutic exercise    PT Goals (Current goals can be found in the Care Plan section)  Acute Rehab PT Goals Patient Stated Goal: homoe PT Goal Formulation: With patient Time For Goal Achievement: 05/06/17 Potential to Achieve Goals: Good    Frequency     Barriers to discharge        Co-evaluation               AM-PAC PT "6 Clicks" Daily Activity  Outcome Measure Difficulty turning over in bed (including adjusting bedclothes, sheets and blankets)?: A Little Difficulty moving from lying on back to sitting  on the side of the bed? : A Little Difficulty sitting down on and standing up from a chair with arms (e.g., wheelchair, bedside commode, etc,.)?: A Little Help needed moving to and from a bed to chair (including a wheelchair)?: A Little Help needed walking in hospital room?: A Little Help needed climbing 3-5 steps with a railing? : A Lot 6 Click Score: 17    End of Session Equipment Utilized During Treatment: Gait belt Activity Tolerance: Patient tolerated treatment well Patient left: in chair;with call bell/phone within reach;with chair alarm set   PT Visit Diagnosis: Muscle weakness (generalized) (M62.81);Difficulty in walking, not elsewhere classified (R26.2)    Time: [id number] PT Time Calculation (min) (ACUTE ONLY): 14 min   Charges:   PT Evaluation $PT Eval Moderate Complexity: 1 Mod     PT G Codes:          Rebeca AlertJannie Graciella Arment, MPT Pager: [phone]

## 2017-05-10 ENCOUNTER — Telehealth: Payer: Self-pay | Admitting: *Deleted

## 2017-05-10 NOTE — Telephone Encounter (Signed)
Received PA request from [facility] [place] for rx Keppra 750mg  tab.  Submitted PA on covermymeds. Key: [id number]. PA ZOXW#[id number]#[id number].   Approved 02/09/2017-05/09/2017.  Faxed notice of approval to [facility] at [phone]. Received fax confirmation.

## 2017-05-13 ENCOUNTER — Other Ambulatory Visit: Payer: Self-pay | Admitting: Neurology

## 2017-05-17 ENCOUNTER — Telehealth: Payer: Self-pay | Admitting: Neurology

## 2017-05-17 MED ORDER — KEPPRA 750 MG PO TABS: ORAL_TABLET | ORAL | 0 refills | Status: DC

## 2017-05-17 NOTE — Telephone Encounter (Signed)
E-scribed refills to pt pharmacy as requested x1 month until f/u on 06/15/17 with MM,NP.

## 2017-05-17 NOTE — Telephone Encounter (Signed)
Patient will run out of Keppra before next apt in May. Best call back is [phone] but he says it is not currently working.

## 2017-05-23 ENCOUNTER — Other Ambulatory Visit: Payer: Self-pay | Admitting: Neurology

## 2017-05-24 ENCOUNTER — Telehealth: Payer: Self-pay | Admitting: *Deleted

## 2017-05-24 ENCOUNTER — Encounter: Payer: Self-pay | Admitting: *Deleted

## 2017-05-24 NOTE — Telephone Encounter (Signed)
Received letter from pt.  He is asking to move up appt so he can get his sz medications.  I note that he does have medications to last until he is seen 06-15-17 with MM.NP.  [facility].  She does note that keppra prescription is available and will get ready for pt, but notes that lamictal that there is no prescription. (even though our records show 3/08-2017).  I gave verbal order for the same prescription (lamictal 200mg  po AM and 400mg  po PM). # 270 with 3 RF.  Letter to pt as his phone did not work.

## 2017-05-31 ENCOUNTER — Other Ambulatory Visit: Payer: Self-pay | Admitting: Neurology

## 2017-06-03 ENCOUNTER — Telehealth: Payer: Self-pay | Admitting: Neurology

## 2017-06-03 NOTE — Telephone Encounter (Signed)
I tried to clarify w/ Craig Fox which medications patient requesting refill on, but patient was not able to clarify while he was in the office

## 2017-06-03 NOTE — Telephone Encounter (Signed)
I checked Epic and he should have enough medication to get him until appt on 06/15/17. This is his yearly f/u w. MM,NP [facility] [facility] [place], [place]

## 2017-06-03 NOTE — Telephone Encounter (Signed)
Pt. states he needs refills for all his meds. Says home phone is not working, will call pharmacy to check if it has been called in or will find a phone to call back here to check the status of the refills.

## 2017-06-10 NOTE — Telephone Encounter (Signed)
error 

## 2017-06-15 ENCOUNTER — Telehealth: Payer: Self-pay | Admitting: Adult Health

## 2017-06-15 ENCOUNTER — Ambulatory Visit (INDEPENDENT_AMBULATORY_CARE_PROVIDER_SITE_OTHER): Payer: Medicare Other | Admitting: Adult Health

## 2017-06-15 ENCOUNTER — Encounter: Payer: Self-pay | Admitting: Adult Health

## 2017-06-15 VITALS — BP 130/69 | HR 63 | Ht 72.0 in | Wt 167.0 lb

## 2017-06-15 DIAGNOSIS — Z5181 Encounter for therapeutic drug level monitoring: Secondary | ICD-10-CM

## 2017-06-15 DIAGNOSIS — G40919 Epilepsy, unspecified, intractable, without status epilepticus: Secondary | ICD-10-CM

## 2017-06-15 MED ORDER — KEPPRA 750 MG PO TABS: ORAL_TABLET | ORAL | 11 refills | Status: AC

## 2017-06-15 MED ORDER — PHENOBARBITAL 97.2 MG PO TABS: ORAL_TABLET | ORAL | 0 refills | Status: DC

## 2017-06-15 NOTE — Progress Notes (Signed)
PATIENT: GAD AYMOND DOB: 03/10/1955  REASON FOR VISIT: follow up HISTORY FROM: patient  HISTORY OF PRESENT ILLNESS: Today 06/15/17 Mr. Mciver is a 62 year old male with a history of intractable epilepsy.  He returns today for follow-up.  He is currently on Keppra, phenobarbital and Lamictal.  He reports that he had a seizure back in March.  He feels that he has had an additional seizure since then but does not remember the date.  He acknowledges that he sometimes forgets to take his medication.  He also makes mention that he is noncompliant with his medication.  Patient lives at home alone.  He is able to complete all ADLs independently.  He does not operate a motor vehicle.  He has an aunt check on him frequently.  Patient does state that he would like his phenobarbital dose increased.  He returns today for an evaluation.    HISTORY Mr. Grizzell is a 62 year old male with a history of intractable epilepsy. He is on Keppra, phenobarbital and Lamictal. He returns today for follow-up. He states that approximately 1 week ago he had a nocturnal seizure. He reports that this was not witnessed. He states that he "felt as if he had had a seizure." He states that he had had a fever a few days prior and this usually is a trigger for his seizure events according to the patient. He currently lives in an apartment alone. He does not operate a motor vehicle. Reports that he is able to complete all ADLs independently. He states that the night he had a seizure he cannot be sure that he did not miss any medication. He returns today for an evaluation.  HISTORY 05/06/15: Mr. Patry is a 62 year old right-handed white male with a history of intractable epilepsy. The patient believes that he last had a seizure about 2 weeks ago coming out of sleep. The patient felt tired and out of it the next day. The patient otherwise has done fairly well with his seizure control. He does not operate a motor vehicle. He is on  phenobarbital, Keppra, and Lamictal. He tolerates the medications well. He has to rely upon public transportation to get around. He lives by himself. He has missed the last 3 revisits. He returns to this office for an evaluation.    REVIEW OF SYSTEMS: Out of a complete 14 system review of symptoms, the patient complains only of the following symptoms, and all other reviewed systems are negative.  Seizures  ALLERGIES: No Known Allergies  HOME MEDICATIONS: Outpatient Medications Prior to Visit  Medication Sig Dispense Refill  . KEPPRA 750 MG tablet TAKE 1 TABLET (750 MG TOTAL) BY MOUTH 2 (TWO) TIMES DAILY. 60 tablet 0  . LAMICTAL 200 MG tablet ONE TABLET IN THE MORNING AND TWO TABLETS IN THE EVENING 270 tablet 3  . PHENobarbital (LUMINAL) 97.2 MG tablet TAKE 1 TABLET BY MOUTH AT BEDTIME 90 tablet 0  . bacitracin ointment Apply topically 2 (two) times daily. 120 g 0   No facility-administered medications prior to visit.     PAST MEDICAL HISTORY: Past Medical History:  Diagnosis Date  . Depression   . Gait disorder   . 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 Seizures (HCC)   . Traumatic brain injury [facility])     PAST SURGICAL HISTORY: Past Surgical History:  Procedure Laterality Date  . traumatic amputation finger Left    Ring finger    FAMILY HISTORY: Family History  Problem Relation Age of Onset  . Mental illness Mother   . Heart attack Father     SOCIAL HISTORY: Social History   Socioeconomic History  . Marital status: Single    Spouse name: Not on file  . Number of children: 0  . Years of education: hs  . Highest education level: Not on file  Occupational History  . Occupation: disablilty  Social Needs  . Financial resource strain: Not on file  . Food insecurity:    Worry: Not on file    Inability: Not on file  . Transportation needs:    Medical: Not on file    Non-medical: Not on file  Tobacco Use  . Smoking status:  Never Smoker  . Smokeless tobacco: Never Used  Substance and Sexual Activity  . Alcohol use: No  . Drug use: No  . Sexual activity: Not on file  Lifestyle  . Physical activity:    Days per week: Not on file    Minutes per session: Not on file  . Stress: Not on file  Relationships  . Social connections:    Talks on phone: Not on file    Gets together: Not on file    Attends religious service: Not on file    Active member of club or organization: Not on file    Attends meetings of clubs or organizations: Not on file    Relationship status: Not on file  . Intimate partner violence:    Fear of current or ex partner: Not on file    Emotionally abused: Not on file    Physically abused: Not on file    Forced sexual activity: Not on file  Other Topics Concern  . Not on file  Social History Narrative  . Not on file      PHYSICAL EXAM  Vitals:   06/15/17 1331  BP: 130/69  Pulse: 63  Weight: 167 lb (75.8 kg)  Height: 6' (1.829 m)   Body mass index is 22.65 kg/m.  Generalized: Well developed, in no acute distress, patient clothes are soiled.   Neurological examination  Mentation: Alert oriented to time, place, history taking. Follows all commands speech and language fluent Cranial nerve II-XII: Pupils were equal round reactive to light. Extraocular movements were full, visual field were full on confrontational test. Facial sensation and strength were normal. Uvula tongue midline. Head turning and shoulder shrug  were normal and symmetric. Motor: The motor testing reveals 5 over 5 strength of all 4 extremities. Good symmetric motor tone is noted throughout.  Sensory: Sensory testing is intact to soft touch on all 4 extremities. No evidence of extinction is noted.  Coordination: Cerebellar testing reveals good finger-nose-finger and heel-to-shin bilaterally.  Gait and station: Gait is normal.  Reflexes: Deep tendon reflexes are symmetric and normal bilaterally.   DIAGNOSTIC  DATA (LABS, IMAGING, TESTING) - I reviewed patient records, labs, notes, testing and imaging myself where available.  Lab Results  Component Value Date   WBC 6.5 04/21/2017   HGB 14.2 04/21/2017   HCT 42.3 04/21/2017   MCV 90.4 04/21/2017   PLT 220 04/21/2017      Component Value Date/Time   NA 133 (L) 04/21/2017 0627   NA 140 05/21/2016 1317   K 4.0 04/21/2017 0627   CL 100 (L) 04/21/2017 0627   CO2 23 04/21/2017 0627   GLUCOSE 100 (H) 04/21/2017 0627   BUN 16 04/21/2017 0627   BUN 13 05/21/2016 1317   CREATININE 0.89 04/21/2017  1610   CALCIUM 8.5 (L) 04/21/2017 0627   PROT 6.7 04/21/2017 0627   PROT 7.3 05/21/2016 1317   ALBUMIN 3.8 04/21/2017 0627   ALBUMIN 4.9 (H) 05/21/2016 1317   AST 28 04/21/2017 0627   ALT 18 04/21/2017 0627   ALKPHOS 133 (H) 04/21/2017 0627   BILITOT 0.9 04/21/2017 0627   BILITOT <0.2 05/21/2016 1317   GFRNONAA >60 04/21/2017 0627   GFRAA >60 04/21/2017 0627      ASSESSMENT AND PLAN 62 y.o. 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 here with :  1.  Intractable epilepsy  I discussed with the patient about being compliant with his medication.  He informed me that he "has been dealing with this disease for 60 years that he knows what he is doing."  I advised that I would check blood work today.  Also advised that I would not increase his phenobarbital level at this time.  Advised again that he has to be compliant with his medication as he acknowledged earlier in the visit that he did forget medication at times.  He is advised that if he has any seizure events he should let us know.  He will follow-up in 6 months or sooner as needed.   I spent 15 minutes with the patient 50% of this time was spent discussing his medication compliance.   Butch Penny, MSN, NP-C 06/15/2017, 1:35 PM [facility] [address] [place], [place] [address] [phone]

## 2017-06-15 NOTE — Progress Notes (Signed)
I have read the note, and I agree with the clinical assessment and plan.  Dashton Czerwinski K Trypp Heckmann   

## 2017-06-15 NOTE — Telephone Encounter (Signed)
Patient in lobby wanting to know why Lamictal is not on the list of medications for him to pick up

## 2017-06-15 NOTE — Telephone Encounter (Signed)
Attempted x 2 to reach patient on only # listed. Message immediately stated "call cannot be completed at this time". Lamictal was refilled on 04/15/17 for 1 year to [facility], [address]. Phone staff may inform if patient calls back.

## 2017-06-15 NOTE — Patient Instructions (Signed)
Your Plan:  Continue Phenobarbital, Keppra, and Lamictal Blood work today If your symptoms worsen or you develop new symptoms please let us know.   Thank you for coming to see [place] at [facility]. I hope we have been able to provide you high quality care today.  You may receive a patient satisfaction survey over the next few weeks. We would appreciate your feedback and comments so that we may continue to improve ourselves and the health of our patients.

## 2017-06-17 ENCOUNTER — Telehealth: Payer: Self-pay | Admitting: *Deleted

## 2017-06-17 LAB — COMPREHENSIVE METABOLIC PANEL
ALT: 19 IU/L (ref 0–44)
AST: 22 IU/L (ref 0–40)
Albumin/Globulin Ratio: 2.1 (ref 1.2–2.2)
Albumin: 4.8 g/dL (ref 3.6–4.8)
Alkaline Phosphatase: 144 IU/L — ABNORMAL HIGH (ref 39–117)
BUN/Creatinine Ratio: 12 (ref 10–24)
BUN: 13 mg/dL (ref 8–27)
Bilirubin Total: 0.3 mg/dL (ref 0.0–1.2)
CALCIUM: 10.1 mg/dL (ref 8.6–10.2)
CO2: 26 mmol/L (ref 20–29)
Chloride: 97 mmol/L (ref 96–106)
Creatinine, Ser: 1.12 mg/dL (ref 0.76–1.27)
GFR calc non Af Amer: 71 mL/min/{1.73_m2} (ref >59)
GFR, EST AFRICAN AMERICAN: 82 mL/min/{1.73_m2} (ref >59)
GLUCOSE: 92 mg/dL (ref 65–99)
Globulin, Total: 2.3 g/dL (ref 1.5–4.5)
Potassium: 5.3 mmol/L — ABNORMAL HIGH (ref 3.5–5.2)
Sodium: 139 mmol/L (ref 134–144)
Total Protein: 7.1 g/dL (ref 6.0–8.5)

## 2017-06-17 LAB — CBC WITH DIFFERENTIAL/PLATELET
BASOS ABS: 0 10*3/uL (ref 0.0–0.2)
BASOS: 0 %
EOS (ABSOLUTE): 0.1 10*3/uL (ref 0.0–0.4)
Eos: 1 %
Hematocrit: 41.2 % (ref 37.5–51.0)
Hemoglobin: 13.7 g/dL (ref 13.0–17.7)
IMMATURE GRANS (ABS): 0 10*3/uL (ref 0.0–0.1)
IMMATURE GRANULOCYTES: 0 %
LYMPHS: 15 %
Lymphocytes Absolute: 1.2 10*3/uL (ref 0.7–3.1)
MCH: 29.7 pg (ref 26.6–33.0)
MCHC: 33.3 g/dL (ref 31.5–35.7)
MCV: 89 fL (ref 79–97)
MONOCYTES: 9 %
Monocytes Absolute: 0.7 10*3/uL (ref 0.1–0.9)
NEUTROS ABS: 6.1 10*3/uL (ref 1.4–7.0)
NEUTROS PCT: 75 %
PLATELETS: 274 10*3/uL (ref 150–379)
RBC: 4.61 x10E6/uL (ref 4.14–5.80)
RDW: 14.6 % (ref 12.3–15.4)
WBC: 8 10*3/uL (ref 3.4–10.8)

## 2017-06-17 LAB — LAMOTRIGINE LEVEL: LAMOTRIGINE LVL: 5.4 ug/mL (ref 2.0–20.0)

## 2017-06-17 LAB — PHENOBARBITAL LEVEL: Phenobarbital, Serum: 15 ug/mL (ref 15–40)

## 2017-06-17 NOTE — Telephone Encounter (Signed)
-----   Message from Butch Penny, NP sent at 06/17/2017  2:28 PM EDT ----- Blood work is consistent with previous blood work.  His alkaline phosphatase is elevated.  We will need to continue to monitor.  No changes in his medication at this time.  Please call patient

## 2017-06-17 NOTE — Telephone Encounter (Signed)
Attempted to call patient twice. Received message that call could not be completed at this time. Verified that number was entered correctly.

## 2017-06-21 NOTE — Telephone Encounter (Addendum)
Tried to call pt again. Received message that call could not be completed.

## 2017-06-22 ENCOUNTER — Encounter: Payer: Self-pay | Admitting: *Deleted

## 2017-06-22 NOTE — Telephone Encounter (Signed)
Tried to call patient a final time. Received immediate message that call could not be completed at this time. Will send letter to pt's home.

## 2017-06-25 ENCOUNTER — Other Ambulatory Visit: Payer: Self-pay | Admitting: Neurology

## 2017-10-11 ENCOUNTER — Other Ambulatory Visit: Payer: Self-pay | Admitting: Adult Health

## 2017-12-16 ENCOUNTER — Ambulatory Visit: Payer: Medicare Other | Admitting: Neurology

## 2017-12-16 ENCOUNTER — Telehealth: Payer: Self-pay | Admitting: Neurology

## 2017-12-16 NOTE — Telephone Encounter (Signed)
This patient did not show for a revisit appointment today. 

## 2017-12-20 ENCOUNTER — Encounter: Payer: Self-pay | Admitting: Neurology

## 2017-12-21 DIAGNOSIS — I469 Cardiac arrest, cause unspecified: Secondary | ICD-10-CM | POA: Diagnosis not present

## 2017-12-21 DIAGNOSIS — R404 Transient alteration of awareness: Secondary | ICD-10-CM | POA: Diagnosis not present

## 2018-01-09 DIAGNOSIS — 419620001 Death: Secondary | SNOMED CT | POA: Diagnosis not present

## 2018-01-09 DEATH — deceased
